# Patient Record
Sex: Female | Born: 1970
Health system: Southern US, Community
[De-identification: ages and names within clinical notes are randomized; demographics above are authoritative.]

## PROBLEM LIST (undated history)

## (undated) DIAGNOSIS — E079 Disorder of thyroid, unspecified: Secondary | ICD-10-CM

## (undated) DIAGNOSIS — I10 Essential (primary) hypertension: Secondary | ICD-10-CM

## (undated) DIAGNOSIS — N390 Urinary tract infection, site not specified: Secondary | ICD-10-CM

## (undated) DIAGNOSIS — M545 Low back pain, unspecified: Secondary | ICD-10-CM

## (undated) DIAGNOSIS — G4700 Insomnia, unspecified: Secondary | ICD-10-CM

## (undated) DIAGNOSIS — F32A Depression, unspecified: Secondary | ICD-10-CM

## (undated) DIAGNOSIS — E785 Hyperlipidemia, unspecified: Secondary | ICD-10-CM

## (undated) DIAGNOSIS — F329 Major depressive disorder, single episode, unspecified: Secondary | ICD-10-CM

## (undated) DIAGNOSIS — G8929 Other chronic pain: Secondary | ICD-10-CM

## (undated) HISTORY — DX: Depression, unspecified: F32.A

## (undated) HISTORY — PX: CHOLECYSTECTOMY: SHX55

## (undated) HISTORY — DX: Hyperlipidemia, unspecified: E78.5

## (undated) HISTORY — DX: Other chronic pain: G89.29

## (undated) HISTORY — DX: Urinary tract infection, site not specified: N39.0

## (undated) HISTORY — DX: Low back pain: M54.5

## (undated) HISTORY — DX: Essential (primary) hypertension: I10

## (undated) HISTORY — DX: Disorder of thyroid, unspecified: E07.9

## (undated) HISTORY — DX: Major depressive disorder, single episode, unspecified: F32.9

## (undated) HISTORY — DX: Low back pain, unspecified: M54.50

## (undated) HISTORY — DX: Insomnia, unspecified: G47.00

## (undated) HISTORY — PX: OTHER SURGICAL HISTORY: SHX169

## (undated) HISTORY — PX: BREAST SURGERY: SHX581

## (undated) HISTORY — PX: ENDOMETRIAL ABLATION: SHX621

## (undated) HISTORY — PX: TOTAL HIP ARTHROPLASTY: SHX124

---

## 1991-04-02 HISTORY — PX: BREAST EXCISIONAL BIOPSY: SUR124

## 1997-05-02 ENCOUNTER — Encounter: Admission: RE | Admit: 1997-05-02 | Discharge: 1997-07-31 | Payer: Self-pay | Admitting: Obstetrics and Gynecology

## 1997-09-06 ENCOUNTER — Ambulatory Visit (HOSPITAL_COMMUNITY): Admission: RE | Admit: 1997-09-06 | Discharge: 1997-09-06 | Payer: Self-pay | Admitting: Specialist

## 1997-09-06 ENCOUNTER — Encounter (HOSPITAL_COMMUNITY): Admission: RE | Admit: 1997-09-06 | Discharge: 1997-12-05 | Payer: Self-pay | Admitting: Obstetrics and Gynecology

## 1997-12-29 ENCOUNTER — Encounter (HOSPITAL_COMMUNITY): Admission: RE | Admit: 1997-12-29 | Discharge: 1998-03-29 | Payer: Self-pay | Admitting: *Deleted

## 1998-07-05 ENCOUNTER — Inpatient Hospital Stay (HOSPITAL_COMMUNITY): Admission: AD | Admit: 1998-07-05 | Discharge: 1998-07-05 | Payer: Self-pay | Admitting: Obstetrics and Gynecology

## 1998-09-07 ENCOUNTER — Observation Stay (HOSPITAL_COMMUNITY): Admission: RE | Admit: 1998-09-07 | Discharge: 1998-09-08 | Payer: Self-pay | Admitting: Surgery

## 1999-01-22 ENCOUNTER — Other Ambulatory Visit: Admission: RE | Admit: 1999-01-22 | Discharge: 1999-01-22 | Payer: Self-pay | Admitting: Obstetrics and Gynecology

## 1999-04-02 HISTORY — PX: THYROIDECTOMY: SHX17

## 1999-06-05 ENCOUNTER — Encounter: Payer: Self-pay | Admitting: Emergency Medicine

## 1999-06-05 ENCOUNTER — Emergency Department (HOSPITAL_COMMUNITY): Admission: EM | Admit: 1999-06-05 | Discharge: 1999-06-05 | Payer: Self-pay | Admitting: Internal Medicine

## 1999-07-25 ENCOUNTER — Inpatient Hospital Stay (HOSPITAL_COMMUNITY): Admission: AD | Admit: 1999-07-25 | Discharge: 1999-07-28 | Payer: Self-pay | Admitting: Obstetrics and Gynecology

## 1999-08-30 ENCOUNTER — Other Ambulatory Visit: Admission: RE | Admit: 1999-08-30 | Discharge: 1999-08-30 | Payer: Self-pay | Admitting: Obstetrics and Gynecology

## 2000-10-21 ENCOUNTER — Encounter: Admission: RE | Admit: 2000-10-21 | Discharge: 2000-10-21 | Payer: Self-pay | Admitting: Obstetrics and Gynecology

## 2000-10-21 ENCOUNTER — Encounter: Payer: Self-pay | Admitting: Obstetrics and Gynecology

## 2000-11-21 ENCOUNTER — Other Ambulatory Visit: Admission: RE | Admit: 2000-11-21 | Discharge: 2000-11-21 | Payer: Self-pay | Admitting: Obstetrics and Gynecology

## 2001-04-24 ENCOUNTER — Inpatient Hospital Stay (HOSPITAL_COMMUNITY): Admission: AD | Admit: 2001-04-24 | Discharge: 2001-04-24 | Payer: Self-pay | Admitting: Obstetrics and Gynecology

## 2001-05-29 ENCOUNTER — Ambulatory Visit (HOSPITAL_COMMUNITY): Admission: RE | Admit: 2001-05-29 | Discharge: 2001-05-29 | Payer: Self-pay | Admitting: Obstetrics and Gynecology

## 2001-05-30 ENCOUNTER — Inpatient Hospital Stay (HOSPITAL_COMMUNITY): Admission: AD | Admit: 2001-05-30 | Discharge: 2001-06-03 | Payer: Self-pay | Admitting: Obstetrics and Gynecology

## 2001-05-30 ENCOUNTER — Encounter (INDEPENDENT_AMBULATORY_CARE_PROVIDER_SITE_OTHER): Payer: Self-pay | Admitting: Specialist

## 2001-07-06 ENCOUNTER — Other Ambulatory Visit: Admission: RE | Admit: 2001-07-06 | Discharge: 2001-07-06 | Payer: Self-pay | Admitting: Obstetrics and Gynecology

## 2003-04-05 ENCOUNTER — Other Ambulatory Visit: Admission: RE | Admit: 2003-04-05 | Discharge: 2003-04-05 | Payer: Self-pay | Admitting: Obstetrics and Gynecology

## 2004-09-06 ENCOUNTER — Other Ambulatory Visit: Admission: RE | Admit: 2004-09-06 | Discharge: 2004-09-06 | Payer: Self-pay | Admitting: Obstetrics and Gynecology

## 2004-11-09 ENCOUNTER — Ambulatory Visit (HOSPITAL_COMMUNITY): Admission: RE | Admit: 2004-11-09 | Discharge: 2004-11-09 | Payer: Self-pay | Admitting: Obstetrics and Gynecology

## 2004-11-09 ENCOUNTER — Encounter (INDEPENDENT_AMBULATORY_CARE_PROVIDER_SITE_OTHER): Payer: Self-pay | Admitting: Specialist

## 2004-11-09 ENCOUNTER — Inpatient Hospital Stay (HOSPITAL_COMMUNITY): Admission: AD | Admit: 2004-11-09 | Discharge: 2004-11-09 | Payer: Self-pay | Admitting: Obstetrics and Gynecology

## 2005-03-26 ENCOUNTER — Emergency Department (HOSPITAL_COMMUNITY): Admission: EM | Admit: 2005-03-26 | Discharge: 2005-03-27 | Payer: Self-pay | Admitting: Emergency Medicine

## 2005-08-27 ENCOUNTER — Emergency Department (HOSPITAL_COMMUNITY): Admission: EM | Admit: 2005-08-27 | Discharge: 2005-08-28 | Payer: Self-pay | Admitting: Emergency Medicine

## 2005-10-17 ENCOUNTER — Ambulatory Visit (HOSPITAL_COMMUNITY): Admission: RE | Admit: 2005-10-17 | Discharge: 2005-10-17 | Payer: Self-pay | Admitting: General Surgery

## 2005-10-17 ENCOUNTER — Encounter (INDEPENDENT_AMBULATORY_CARE_PROVIDER_SITE_OTHER): Payer: Self-pay | Admitting: *Deleted

## 2007-01-26 ENCOUNTER — Ambulatory Visit (HOSPITAL_COMMUNITY): Admission: RE | Admit: 2007-01-26 | Discharge: 2007-01-26 | Payer: Self-pay | Admitting: Family Medicine

## 2007-03-18 ENCOUNTER — Ambulatory Visit (HOSPITAL_COMMUNITY): Admission: RE | Admit: 2007-03-18 | Discharge: 2007-03-19 | Payer: Self-pay | Admitting: Neurosurgery

## 2007-05-08 ENCOUNTER — Inpatient Hospital Stay (HOSPITAL_COMMUNITY): Admission: AD | Admit: 2007-05-08 | Discharge: 2007-05-12 | Payer: Self-pay | Admitting: Neurosurgery

## 2008-05-03 ENCOUNTER — Encounter: Admission: RE | Admit: 2008-05-03 | Discharge: 2008-05-03 | Payer: Self-pay | Admitting: Neurosurgery

## 2008-11-21 ENCOUNTER — Encounter: Admission: RE | Admit: 2008-11-21 | Discharge: 2008-11-21 | Payer: Self-pay | Admitting: Neurosurgery

## 2009-04-18 ENCOUNTER — Inpatient Hospital Stay (HOSPITAL_COMMUNITY): Admission: RE | Admit: 2009-04-18 | Discharge: 2009-04-20 | Payer: Self-pay | Admitting: Neurological Surgery

## 2010-04-21 ENCOUNTER — Encounter: Payer: Self-pay | Admitting: Family Medicine

## 2010-04-22 ENCOUNTER — Encounter: Payer: Self-pay | Admitting: Family Medicine

## 2010-06-17 LAB — COMPREHENSIVE METABOLIC PANEL
AST: 21 U/L (ref 0–37)
Albumin: 3.9 g/dL (ref 3.5–5.2)
BUN: 13 mg/dL (ref 6–23)
CO2: 27 mEq/L (ref 19–32)
Chloride: 105 mEq/L (ref 96–112)
Potassium: 4.8 mEq/L (ref 3.5–5.1)
Sodium: 138 mEq/L (ref 135–145)
Total Protein: 6.9 g/dL (ref 6.0–8.3)

## 2010-06-17 LAB — CBC
MCV: 94.2 fL (ref 78.0–100.0)
WBC: 8.9 10*3/uL (ref 4.0–10.5)

## 2010-06-17 LAB — TYPE AND SCREEN: ABO/RH(D): A POS

## 2010-08-14 NOTE — H&P (Signed)
Maria [name], CHATWIN NO.:  [id number]   MEDICAL RECORD NO.:  [id number]          PATIENT TYPE:  INP   LOCATION:  3002                         FACILITY:  [facility]   PHYSICIAN:  Hewitt Shorts, M.D.DATE OF BIRTH:  02/17/1971   DATE OF ADMISSION:  05/08/2007  DATE OF DISCHARGE:                              HISTORY & PHYSICAL   HISTORY OF PRESENT ILLNESS:  The patient is a 40 year old right-handed  white female who is 6-1/2 weeks status post right L5-S1 lumbar  laminotomy and microdiskectomy for a very large right L5-S1 disk  herniation.  She did well for the first 5 weeks but about a week and  half ago, she developed severe recurrent right lumbar radicular pain  from the right buttock extending down to the posterolateral right thigh  and leg and throughout the right foot with numbness and tingling in the  right buttock, posterolateral right thigh, and leg, and into the lateral  aspect of the right foot.  The patient was reevaluated earlier this  week.  She had been using ibuprofen and Flexeril but continued in  disabling pain.  We restudied her with MRI of the lumbar spine done  without and with gadolinium.  That study showed recurrent disk  herniation at L5-S1, not nearly as large the original disk herniation  but posteriorly displacing the right S1 nerve root.   We discussed options for further treatment and care including further  nonsurgical management as well as options for more limited surgeries,  specifically a right L5-S1 lumbar laminotomy and microdiskectomy for  recurrent disc herniation as well as more extensive surgery including L5-  S1 decompression, diskectomy, posterior lumbar interbody fusion, and  posterolateral arthrodesis.   After discussing this, she wished to go ahead with the decompression and  arthrodesis, and we are planning on working on getting that scheduled.  However, she contacted the office today because of the severity of the  pain.  She felt that she could no longer bear with the pain, and  therefore we admitted the patient for pain management and to plan on  proceeding with the surgery.   PAST MEDICAL HISTORY:  Notable for history of hypertension since 2005,  history of benign right breast tumor, benign thyroid tumor,  hypercholesterolemia, and irritable bowel syndrome.  No history of  myocardial infarction, cancer, stroke, diabetes, or lung disease.   PAST SURGICAL HISTORY:  Previous surgeries include a right breast biopsy  in October 1993; ORIF of the right elbow in 1997; right thyroid  lobectomy in June 1999; cesarean section in 2003; endometrial ablation,  D and C, and hysteroscopy in 2005; laparoscopic cholecystectomy in 2006;  and right L5-S1 lumbar diskectomy on March 18, 2007.   ALLERGIES:  She reports allergies to SULFA causing rash and COMPAZINE  making her jittery and restless, but not a true allergy but more of an  intolerance.   CURRENT MEDICATIONS:  1. Lotrel 10/20 mg daily.  2. Zocor 40 mg daily.  3. Lasix 20 mg daily.  4. Synthroid 50 mcg daily.  5. Potassium chloride 10 mEq daily.  6. Effexor  XR 75 mg daily.  7. Flexeril 10 mg three times a day.  8. Ambien CR 12.5 mg q.h.s.  9. Hydrocodone q.6 h. p.r.n. pain.   FAMILY HISTORY:  Mother is in good health at age 74 with hypertension.  Father is in good health at age 65.  There is a family history of kidney  failure, coronary artery disease, myocardial infarction, and non-Hodgkin  lymphoma.   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  She is married.  She smokes a half a pack a day.  She started smoking 5-  1/2 years ago.  She drinks alcoholic  beverages socially.  She denies  history of substance abuse.   REVIEW OF SYSTEMS:  Notable for that as is described in the history of  present illness and past medical history, but it is otherwise  unremarkable.   PHYSICAL EXAMINATION:  GENERAL: The patient  is a well-developed, well-  nourished white female in obvious pain but in no acute distress.  VITAL SIGNS:  Temperature is 99, pulse 104, and blood pressure 128/92.  LUNGS:  Clear to auscultation.  She has symmetric respiratory excursion.  HEART:  Regular rate and rhythm.  Normal S1, S2.  There is no murmur.  EXTREMITIES:  Examination shows no clubbing, cyanosis, or edema.  INTEGUMENT:  Exam shows her wound is healed well.  NEUROLOGIC:  Examination shows on motor exam 5/5 strength in the  dorsiflexors, extensor hallucis longus, and plantar flexors bilaterally.  Sensation is diminished to pinprick in the lower aspect of the right  foot.  Reflexes are 2 at the quadriceps, the left gastrocnemius is 1,  and in the right it is absent.  Toes are downgoing bilaterally.  Gait  and stance both favor the right lower extremity.   IMPRESSION:  Recurrent right lumbar radiculopathy secondary to a  recurrent right L5-S1 lumbar disk herniation documented on MRI scan.  The patient is a 6-1/2 weeks status post right L5-S1 lumbar diskectomy.  The patient is with disabling pain at this time with intact motor  function but diminished sensation in the lateral aspect of the right  foot.   PLAN:  The patient will be admitted and administered analgesics.  I have  discussed with her the situation.  She wishes to go ahead with surgical  decompression and arthrodesis.  We discussed alternatives of surgery  that of a more limited right L5-S1 lumbar laminotomy and microdiskectomy  for recurrent disk herniation.  We have discussed as well L5-S1 lumbar  decompression, diskectomy, posterior lumbar interbody fusion with  interbody implants and bone graft to the posterolateral arthrodesis with  posterior instrumentation and bone graft.  We discussed typical  length  of surgery, hospital stay and overall recuperation, limitations  postoperatively and need for postoperative immobilization in the lumbar  brace.  We  discussed risks to include risks of infection; bleeding;  possible transfusion; the risk of nerve root dysfunction, pain,  weakness, numbness, or paresthesias; the risks of dural tear, CSF  leakage, possible need for further surgery; the risk of failure of the  arthrodesis particularly in the light of her smoking history and  anesthetic risk of myocardial infarction, stroke, pneumonia, and death,  again all of which are increased due to her smoking history.  Understanding all this, she wishes to go ahead with surgery and we will  plan on scheduling that for tomorrow.  She understands it will have to  be on the add-on schedule.  We did discuss the alternatives for the  nonsurgical management as well, but she does not feel that she can  continue to bear and put up with the pain that she has been suffering  with.      Hewitt Shorts, M.D.  Electronically Signed     RWN/MEDQ  D:  05/08/2007  T:  05/09/2007  Job:  [id number]

## 2010-08-14 NOTE — Discharge Summary (Signed)
[name], [name] NO.:  [id number]   MEDICAL RECORD NO.:  [id number]          PATIENT TYPE:  INP   LOCATION:  3002                         FACILITY:  [facility]   PHYSICIAN:  Hewitt Shorts, M.D.DATE OF BIRTH:  1970/11/03   DATE OF ADMISSION:  05/08/2007  DATE OF DISCHARGE:  05/12/2007                               DISCHARGE SUMMARY   ADMISSION HISTORY:  The patient is a 40 year old woman who is 6-1/2  weeks status post right L5-S1 lumbar diskectomy for a very large disk  herniation.  She did well for the first 5 weeks, but then about a week  and  a half prior to admission, she developed severe recurrent radicular  pain.  MRI was obtained and revealed recurrent right L5-S1 lumbar disk  herniation and we had planned on proceeding with diskectomy and  arthrodesis.  However, the patient contacted me on the day of admission  because of incapacitating pain.  We admitted her for pain management and  definitive treatment.   PAST HISTORY:  Notable for hypertension, hypercholesterolemia, and  irritable bowel syndrome.   PHYSICAL EXAMINATION:  Revealed a well-developed, well-nourished woman  in obvious pain, but no acute distress.  Strength was intact through the  lower extremities, but she had diminished pin prick in the lateral  aspect of the right foot.   HOSPITAL COURSE:  The patient was admitted.  On the day following  admission, she was taken to surgery for bilateral L5-S1 lumbar  laminotomy, facetectomy, foraminotomy, and decompression of the L5 and  S1 nerve roots as well as the thecal sac and bilateral L5-S1 posterior  lumbar interbody fusion, posterolateral arthrodesis with interbody  implants, posterior instrumentation and bone graft.  Following surgery,  she has had excellent relief of the radicular pain.  She has mild  incisional discomfort.  Her greatest complaint is of numbness in the  left buttock and perineum.  She had difficulty with voiding when  the  Foley was discontinued, therefore the Foley was replaced and Dr. Boston Service from urology was consulted.  The patient was treated with  Decadron by Dr. Venetia Maxon for the numbness.  The patient was seen by  physical therapy and occupational therapy.  The physical therapist felt  there was no further need.  Occupational therapist recommended a toilet  raiser, but otherwise did not feel that there was any further OT needs.  The patient's wound is healing well.  The dressing was removed.  She has  staples.  We have asked her to return in 2 days for staple removal.  She  is to follow up with Dr. Wanda Plump regarding her hypertonic bladder.  She is being discharged with a Foley catheter in place with a leg bag.   DISCHARGE MEDICATIONS:  Discharge prescription was given for:  1. Macrobid one tablet each evening.  2. Percocet one or two tablets by mouth every 4-6 hours as needed for      pain, 60 tablets, no refills.  3. She has Flexeril at home.   DISCHARGE DIAGNOSES:  1. Recurrent lumbar disk herniation.  2. Lumbar spondylosis.  3. Lumbar degenerative disk disease.  4. Lumbar radiculopathy.  5. Urinary retention.   PLAN:  She is to follow up with Dr. Wanda Plump.  His office is going to  contact her to make the arrangements for that appointment.      Hewitt Shorts, M.D.  Electronically Signed     RWN/MEDQ  D:  05/12/2007  T:  05/12/2007  Job:  [id number]   cc:   Boston Service, M.D.

## 2010-08-14 NOTE — Op Note (Signed)
NAMEBLEN, RANSOME NO.:  [id number]   MEDICAL RECORD NO.:  [id number]          PATIENT TYPE:  INP   LOCATION:  3002                         FACILITY:  [facility]   PHYSICIAN:  Hewitt Shorts, M.D.DATE OF BIRTH:  09-Jan-1971   DATE OF PROCEDURE:  05/09/2007  DATE OF DISCHARGE:                               OPERATIVE REPORT   PREOPERATIVE DIAGNOSES:  Recurrent right L5-S1 lumbar disk herniation,  lumbar degenerative disk disease, lumbar spondylosis, and lumbar  radiculopathy.   POSTOPERATIVE DIAGNOSES:  Recurrent right L5-S1 lumbar disk herniation,  lumbar degenerative disk disease, lumbar spondylosis, and lumbar  radiculopathy.   PROCEDURE:  Bilateral L5-S1 lumbar laminectomy, facetectomy, and  foraminotomy with decompression of the exiting L5 and S1 nerve roots  bilaterally with decompression beyond that required for posterior lumbar  interbody fusion (PLIF), bilateral L5-S1 posterior lumbar interbody  fusion with AVS PEEK interbody implants and Mosaic with bone marrow  aspirate and bilateral L5-S1 posterolateral arthrodesis with radius  posterior instrumentation and Mosaic with the bone marrow aspirate with  microdissection.   SURGEON:  Hewitt Shorts, M.D.   ASSISTANT:  Danae Orleans. Venetia Maxon, M.D.   ANESTHESIA:  General endotracheal.   INDICATION:  This is a 40 year old woman at 6-1/2 weeks status post  right L5-S1 lumbar diskectomy.  She did well for 5 weeks but developed  severe recurrent pain about a week and a half ago.  MRI scan revealed  recurrent disk herniation.  There was marked disk space narrowing and  with the early recurrence, decision was made to proceed with  decompression and stabilization.   PROCEDURE:  The patient was brought to the operating room and placed on  general endotracheal anesthesia. The patient was turned to prone  position.  Lumbar region was prepped with Betadine soap and solution and  draped in a sterile fashion. The  midline was infiltrated with local  anesthetic with epinephrine.  The previous midline incision was reopened  and extended both rostrally and caudally.  Dissection was carried down  to the subcutaneous tissue.  Bipolar cautery and electrocautery used to  maintain hemostasis.  Dissection was carried down to the lumbar fascia  which was incised bilaterally.  On the left side, the paraspinal  muscles were dissected from the spinous process and lamina in  subperiosteal fashion.  On the right side, the area of the scar tissue  related to the previous laminectomy was carefully dissected.  We  dissected the paraspinal muscles from the spinous process and lamina  above the previous area of the scar tissue and self-retraining retractor  was placed.  We were able to expose the L4 and L5 lamina.  The S1 lamina  was hypoplastic prior to her original surgery.  We dissected around the  margins of the previous laminotomy on the right side and  using  magnification  and microdissection, we carefully decompressed the thecal  sac and nerve roots.  A bilateral laminotomy and facetectomy was  performed, and we identified the thecal sac and exiting L5 and S1 nerve  roots bilaterally.  The thickened ligamentum flavum was carefully  removed.  The laminotomy was performed and the facets decorticated.  We  identified the  ala of S1 and the transverse process of L5, and these  were similarly decorticated.  We then identified the annulus of the L5-  S1 disk.  There was a significant central spondylytic subligamentous  disk protrusion.  We were able to enter the disk space in the right  side.  The disk space was markedly narrowed.  We began diskectomy using  pituitary rongeur and Epstein curette, and then we proceeded with  diskectomy from the left side incising the annulus and entering into the  disk space removing the degenerated disk material again with pituitary  rongeur and micro curettes.  We were eventually  able to put a small  distraction spacer on the left side which helped to open the disk space  to be able to work from the right side and continue the diskectomy.  We  then placed a distraction spacer on the right side and proceeded with  more thorough diskectomy from the left side and progressively working  from side to side, we were able to perform a thorough diskectomy.  We  were able to remove the recurrent disk herniation which was embedded  within the epidural scar tissue on the right side in the medial ventral  epidural space, and we were able to decompress the spinal canal, thecal  sac, and exiting nerve roots.  A thorough diskectomy was performed and  then we began to prepare the end plates of L5 and S1 for the interbody  arthrodesis.   Using a variety of paddle curettes, the caudal surfaces were removed  down to a good bony surface.  We measured the height of the disk space  and selected 8 x 25 mm x 4 degrees AVS PEEK implants.  The C-arm  fluoroscope was then draped and brought in the field, and we identified  the pedicle intersecting the left-sided S1.  The left S1 pedicle was  probed and we aspirated the bone marrow aspirate which was injected over  15 cc strip of Mosaic.  We then packed the interbody implants with the  bone marrow aspirate soaked  Mosaic and then carefully retracted the  thecal sac and nerve root, we placed with the first implant on the right  side.  It was countersunk.  We then carefully retracted the thecal sac  and nerve root from the left side and placed the second implant on the  left side, again it was countersunk.  C-arm fluoroscope showed that the  implants are in good position.  We then proceeded with the  posterolateral arthrodesis.   Pedicle entry sites were identified bilaterally at L5 as well as on the  right-sided S1, each of the four pedicles was probed with a bone probe,  examined with a ball probe, good bony surfaces were noted, each of  them  was tapped to the 5.25 mm tap and we placed 5.75 x 45 mm screws  bilaterally at L5 and 5.75 x 35 mm screws bilaterally at S1.  We then  selected 30 mm prelordosed rods that were placed in the screw heads and  secured with locking caps, each  of which was subsequently tightened  against the counter torque.  We then packed the additional Mosaic with  bone marrow aspirate in the lateral gutter over the transverse process,  intertransverse space and ala bilaterally as well as within the facet  joints bilaterally.  The wound was irrigated numerous  times in the  procedure with both saline solutions and subsequently with bacitracin  solution.  Once the arthrodesis completed and hemostasis established was  confirmed, we proceeded with closure.  The deep fascia was closed with  interrupted undyed 1-0 Vicryl sutures.  Scarpa fascia was closed with  interrupted undyed 1-0 Vicryl sutures and subcuticular closure with  interrupted  inverted 2-0 and 3-0  undyed Vicryl sutures. Skin edges  were closed with surgical staples.  The wound was dressed with Adaptic  and sterile gauze.  The procedure was tolerated well.  The estimated  blood loss was 300 mL.  We returned 100 mL of CellSaver blood to the  patient.  Sponge and instrument count were correct.  Following surgery,  the patient was turned back to the supine position, reversed from the  anesthetic, extubated, and transferred to the recovery room for further  care.      Hewitt Shorts, M.D.  Electronically Signed     RWN/MEDQ  D:  05/09/2007  T:  05/10/2007  Job:  [id number]

## 2010-08-14 NOTE — Op Note (Signed)
NAMETHOMASENE, DUBOW NO.:  [id number]   MEDICAL RECORD NO.:  [id number]          PATIENT TYPE:  OIB   LOCATION:  3535                         FACILITY:  [facility]   PHYSICIAN:  Hewitt Shorts, M.D.DATE OF BIRTH:  [birth year]   DATE OF PROCEDURE:  03/18/2007  DATE OF DISCHARGE:                               OPERATIVE REPORT   PREOPERATIVE DIAGNOSES:  Right L5-S1 lumbar disc herniation, lumbar  degenerative disc disease, lumbar spondylosis, and lumbar radiculopathy.   POSTOPERATIVE DIAGNOSES:  Right L5-S1 lumbar disc herniation, lumbar  degenerative disc disease, lumbar spondylosis, and lumbar radiculopathy.   PROCEDURE:  Right L5-S1 lumbar laminotomy and microdiscectomy with  microdissection.   SURGEON:  Hewitt Shorts, M.D.   ASSISTANT:  Nelia Shi. Webb Silversmith, N.P.   ANESTHESIA:  General endotracheal.   INDICATIONS:  The patient is a 40 year old woman who presented with a  right lumbar radiculopathy.  She was found to have a very large right L5-  S1 lumbar disc herniation with a marked fragment caudally upon the body  of S1.  Decision was made to proceed with laminotomy and  microdiscectomy.   DESCRIPTION OF PROCEDURE:  The patient was brought to the operating room  and placed under general endotracheal anesthesia.  The patient was  turned to a prone position.  Lumbar region was prepped with Betadine  soap and solution and draped in sterile fashion.  The midline was  infiltrated with local anesthetic with epinephrine.  An x-ray was taken,  the L5-S1 level identified, and a midline incision was made over the L5-  S1 level and carried down to the subcutaneous tissue.  Bipolar cautery  and electrocautery was used to maintain hemostasis.  Dissection was  carried down to the lumbar fascia which was incised on the right side of  the midline and the paraspinal muscle was dissected from the spinous  processes and laminae in a subperiosteal fashion.  An x-ray was  taken  and the L5-S1 interlaminar space was identified.  Then, a self-retaining  retractor was placed and the microscope was draped and brought into the  field to provide additional navigation, illumination, and visualization.  The remainder of the decompression was performed using microdissection  and microsurgical technique.  A right L5-S1 laminotomy was performed.  Foraminotomy was performed for the right S1 nerve root and the S1  laminotomy was extended caudally to avoid exposure to the S1 nerve root  axilla.  The ligamentum flavum was removed and we identified the thecal  sac and right S1 nerve root.  Then within the epidural soft tissues  within the right S1 nerve root axilla was large disc fragment mass with  microdissection.  This was carefully dissected from the surrounding  epidural tissues, and once we were able to adequately separate the disc  fragment from the surrounding nerve root structures, we began to  mobilize and remove the disc fragment in a piecemeal fashion using a  variety of pituitary rongeurs.  Several large fragments were removed as  well as many smaller fragments and this allowed better mobilization of  the right S1 nerve  root.  We then gently retracted the thecal sac and  nerve root medially exposing the annulus of the right L5-S1 disc.  The  overlying epidural veins were coagulated and divided and then we went  ahead and incised the L5-S1 disc.  The discectomy was continued with the  use of variety of microcurettes and pituitary rongeurs and a thorough  discectomy was removed removing all loose fragments of the disc material  from the disc space and then further worked on removing additional  fragments from the epidural space further decompressing the thecal sac,  right S1 nerve root, and right S2 nerve root.  In the end, all loose  fragments of the disc material were removed from both disc space and the  epidural space and good decompression of the thecal sac  and right S1 and  S2 nerve roots was achieved.  The wound was irrigated extensively with  bacitracin solution on numerous occasions.  Hemostasis was established  with the use of bipolar cautery.  Once the discectomy was completed and  hemostasis was established, we instilled 2 mL of fentanyl and 80 mg of  Depo-Medrol into the epidural space and then proceeded with closure.  Deep fascia was closed with interrupted undyed 1 Vicryl suture, Scarpa  fascia was closed with interrupted undyed 1 Vicryl suture, the  subcutaneous and subcuticular layer closed with interrupted inverted 2-0  undyed Vicryl sutures, and the skin was closed with Dermabond.  The  procedure was tolerated well.  Estimated blood loss was 150 mL.  Sponge  and needle counts were correct.  Following surgery, the patient returned  back to supine position to be reversed from the anesthetic, extubated,  and transferred to the recovery room for further care.      Hewitt Shorts, M.D.  Electronically Signed     RWN/MEDQ  D:  03/18/2007  T:  03/18/2007  Job:  [id number]

## 2010-08-17 NOTE — Op Note (Signed)
[facility]  Patient:    Maria [name], Maria [name] Visit Number: [id number] MRN: [id number]          Service Type: OBS Location: [id number] Attending Physician:  Frederich Balding Dictated by:   Juluis Mire, M.D. Proc. Date: 05/30/01 Admit Date:  05/30/2001                             Operative Report  PREOPERATIVE DIAGNOSES:       1. Intrauterine pregnancy at 36-1/2 weeks.                               2. Breech presentation.                               3. Mature fetal pulmonary studies.                               4. Multiparity, desires sterility.  POSTOPERATIVE DIAGNOSES:      1. Intrauterine pregnancy at 36-1/2 weeks.                               2. Breech presentation.                               3. Mature fetal pulmonary studies.                               4. Multiparity, desires sterility.  OPERATIVE PROCEDURES:         1. Low transverse cesarean section.                               2. Bilateral tubal ligation.  SURGEON:                      Juluis Mire, M.D.  ANESTHESIA:                   Spinal.  ESTIMATED BLOOD LOSS:         800 cc.  PACKS AND DRAINS:             None.  INTRAOPERATIVE BLOOD REPLACED:               None.  INDICATIONS:                  As dictated in the history and physical.  The patient is desirous of permanent sterilization.  We have discussed with her the potential irreversibility of sterilization.  Alternatives were discussed. A failure rate of 1 in 200 was quoted.  Failures can be in the form of ectopic pregnancy requiring further surgical management.  DESCRIPTION OF PROCEDURE:     The patient was taken to the OR and placed in the supine position with left lateral tilt.  After a satisfactory level of spinal anesthesia was obtained, the abdomen was prepped out with Betadine and draped as a sterile field.  A low transverse skin incision was made with a knife and carried through the  subcutaneous tissue.  The fascia was entered sharply and the incision in the fascia extended laterally.  The fascia was taken off the muscle superiorly and inferiorly.  The rectus muscles were separated in the midline.  The peritoneum was entered.  The incision in the peritoneum was extended both superiorly and inferiorly.  A low transverse bladder flap was developed.  A low transverse uterine incision was begun in a knife and extended laterally using manual traction.  Amniotic fluid was clear. The infant was in the frank breech presentation.  The infant was a viable female who weighed 6 lb 15 oz.  Apgars were 9/9.  Umbilical artery pH was 7.33.  The placenta was delivered manually.  The uterus was sthen exteriorized.  The uterus was closed with interlocking suture of 0 chromic using a two-layer closure technique.  Hemostasis was excellent.  Urine output was clear and adequate.  Both tubes and ovaries were unremarkable.  Each tube was elevated with a Babcock tenaculum.  A hole was made in an avascular area of the mesosalpinx.  Individual ligatures were used to ligate off a segment of tube. The intervening segment of tube was then excised.  The cut ends of the tubes were cauterized.  We had good hemostasis bilaterally.  The uterus was returned to the abdominal cavity.  The pelvic cavity was thoroughly irrigated and hemostasis was excellent.  The muscles were reapproximated with running suture of 3-0 Vicryl.  The fascia was closed with a running suture of 0 PDS.  The subcu was closed with a running suture of 3-0 Vicryl.  The skin was closed with staples and Steri-Strips.  Sponge, needle and instrument counts were reported as correct by the circulating nurse x 2.  The Foley catheter was clear at the time of closure.  The patient tolerated the procedure well and was returned to the recovery room in good condition. Dictated by:   Juluis Mire, M.D. Attending Physician:  Frederich Balding DD:   05/30/01 TD:  05/30/01 Job: [id number]

## 2010-08-17 NOTE — Op Note (Signed)
Maria [name], Maria [name] NO.:  [id number]   MEDICAL RECORD NO.:  [id number]          PATIENT TYPE:  AMB   LOCATION:  SDC                           FACILITY:  [facility]   PHYSICIAN:  Juluis Mire, M.D.   DATE OF BIRTH:  07/03/1970   DATE OF PROCEDURE:  11/09/2004  DATE OF DISCHARGE:                                 OPERATIVE REPORT   PREOPERATIVE DIAGNOSIS:  Menorrhagia.   POSTOPERATIVE DIAGNOSIS:  Menorrhagia.   OPERATION/PROCEDURE:  1.  Hysteroscopy.  2.  Endometrial curettings.  3.  NovaSure ablation.   SURGEON:  Juluis Mire, M.D.   ANESTHESIA:  General.   ESTIMATED BLOOD LOSS:  Minimal.   PACKS AND DRAINS:  None.   INTRAOPERATIVE BLOOD REPLACED:  None.   COMPLICATIONS:  None.   INDICATIONS:  Dictated in history and physical.   DESCRIPTION OF PROCEDURE:  The patient was taken to the OR, placed in the  supine position.  After satisfactory general anesthesia was obtained,  the  patient was placed in the dorsal lithotomy position using the Allen  stirrups.  The patient perineum and vagina were prepped out and with  Betadine and draped in the sterile field.  Speculum was placed in the  vaginal vault.  Cervix grasped with the single-tooth tenaculum.  Endometrial  sound length was 10 cm and the cervical sound length was 4 cm.  Cervix was dilated to size 26 Pratt dilator.  Nonoperative hysteroscope was  introduced into the intrauterine cavity.  Intrauterine cavity was entered  using lactated Ringer's.  Visualization revealed normal endometrial cavity.  There was no evidence of polyps or other abnormalities.  Hysteroscope was  then removed.  Endometrial curettings were obtained and sent for  pathological view.  The NovaSure was then introduced through a cavity length  of 6 cm.  It was drawn back.  The NovaSure was expanded in the intrauterine  cavity.  After maneuvers, total cavity width was 4.6 cm.  CO2 test passed.  Ablation was undertaken at a power of  152.  The NovaSure was then removed  intact.  Single-  tooth tenaculum and speculum were then removed.  The patient was taken out  of the dorsal lithotomy position. Once alert and extubated was transferred  to the recovery room in good condition.  Sponge, instrument and needle  counts reported as correct by the circulating nurse.      Juluis Mire, M.D.  Electronically Signed     JSM/MEDQ  D:  11/09/2004  T:  11/09/2004  Job:  [id number]

## 2010-08-17 NOTE — Op Note (Signed)
NAMEZEVA, LEBER NO.:  [id number]   MEDICAL RECORD NO.:  [id number]          PATIENT TYPE:  AMB   LOCATION:  DAY                          FACILITY:  [facility]   PHYSICIAN:  Adolph Pollack, M.D.DATE OF BIRTH:  02-07-1971   DATE OF PROCEDURE:  10/17/2005  DATE OF DISCHARGE:                                 OPERATIVE REPORT   PREOPERATIVE DIAGNOSIS:  Symptomatic cholelithiasis.   POSTOPERATIVE DIAGNOSIS:  Chronic cholecystitis with cholelithiasis.   PROCEDURE:  Laparoscopic cholecystectomy with intraoperative cholangiogram.   SURGEON:  Adolph Pollack, M.D.   ASSISTANT:  Ovidio Kin, M.D.   ANESTHESIA:  General.   INDICATIONS:  [name] is a 40 year old female who been having biliary colic  type pain repeatedly and with increasing frequency recently.  She has  gallstones on ultrasound and normal liver function tests and now presents  for laparoscopic cholecystectomy.  We have discussed the procedure, risks  and aftercare preoperatively.   TECHNIQUE:  She was seen in the holding area and then brought to the  operating room and placed supine on the operating table, and a general  anesthetic was administered.  The abdominal wall was sterilely prepped and  draped.  In the supraumbilical region, dilute Marcaine solution was  infiltrated superficially and deep.  A supraumbilical incision was made  through skin, subcutaneous tissue.  The fascia was identified and a small  incision made in the fascia.  The peritoneal cavity was then entered under  direct vision.  A pursestring suture of 0 Vicryl was placed around the  fascial edges.  A Hassan trocar was introduced into the peritoneal cavity,  and a pneumoperitoneum was created by insufflation of CO2 gas.   The laparoscope was introduced and no underlying visceral injury was noted.  The patient was then placed in a reversed Trendelenburg position with the  right side tilted slightly up.  An 11-mm trocar was  placed through an  epigastric incision and two 5-mm trocars placed in the right upper quadrant  region.  The fundus of the gallbladder was identified.  The gallbladder was  a pale red color.  The fundus was grasped and retracted toward the right  shoulder.  There were no significant adhesions between the omentum or  intestine with the gallbladder.  I then used blunt dissection to mobilize  the infundibulum.  I isolated the cystic duct and created a window around  it.  A clip was placed at the cystic duct gallbladder junction.  A small  incision was made in the cystic duct.  I milked some small stones and sludge  out of the cystic duct.  A cholangiocath was then passed through the  anterior abdominal wall and placed into the cystic duct, and a cholangiogram  was performed.   Under real time fluoroscopy, dilute contrast was injected into the cystic  duct which was of moderate length.  The common hepatic, right and left  hepatic, and common bile ducts all opacified promptly and contrast drained  into the duodenum without obvious evidence of obstruction.  Final reports  pending the radiologist's interpretation.  The cholangiocatheter was removed, the cystic duct was clipped three times  proximally and then divided.  I then identified an anterior branch of the  cystic artery and created a window around it.  It was clipped and divided.  The posterior branch of the cystic artery was identified and a window  created around it.  It was clipped and divided.  The gallbladder was then  dissected free from the liver using electrocautery and placed in an  Endopouch bag.   The gallbladder fossa was irrigated, and bleeding was controlled with  electrocautery.  It was once again evaluated, and hemostasis was adequate.  No bile leak was noted.   The gallbladder was then removed in the Endopouch bag through the  supraumbilical port.  The supraumbilical fascial defect was then closed  under  laparoscopic vision by tightening up and tying down the pursestring  suture.  Irrigation fluid was evacuated, trocars removed and  pneumoperitoneum was released.  The skin incisions were closed with 4-0  Monocryl subcuticular stitches followed by Steri-Strips and sterile  dressings.  She tolerated the procedure well without any apparent  complications and was taken to the recovery in satisfactory condition.      Adolph Pollack, M.D.  Electronically Signed     TJR/MEDQ  D:  10/17/2005  T:  10/17/2005  Job:  [id number]   cc:   Ernestina Penna, M.D.  Fax: [phone]   Juluis Mire, M.D.  Fax: [phone]

## 2010-08-17 NOTE — H&P (Signed)
Maria [name], DUPLECHAIN NO.:  [id number]   MEDICAL RECORD NO.:  [id number]           PATIENT TYPE:   LOCATION:                                 FACILITY:   PHYSICIAN:  Juluis Mire, M.D.        DATE OF BIRTH:   DATE OF ADMISSION:  DATE OF DISCHARGE:                                HISTORY & PHYSICAL   The patient is a 40 year old gravida 4, para 4, married female who presents  for hysteroscopy and NovaSure ablation.  Previous bilateral tubal ligation.  Cycles have become increasingly heavy for the patient.  She reports every 28-  day cycles.  Flow is requiring a super tampon every hour on the heaviest  day.  She also has spotting seven to 10 days prior to the onset of her  periods.  She has a total of 14 days of bleed and, as noted, as had a  previous bilateral tubal ligation.  Underwent a saline infusion ultrasound  that was unremarkable except for findings of possible adenomyosis.  After  discussion of options including birth control pills, Mirena IUD,  hysteroscopy and ablative techniques, the patient presents for hysteroscopy  and NovaSure ablation.   In terms of allergies, allergic to SULFA.   MEDICATIONS:  Synthroid.  Ambien as needed for sleep.   PAST MEDICAL HISTORY:  Usual childhood diseases, no significant sequelae.  Did have a previous removal of thyroid, and that is why she is on her  Synthroid replacement.  Other surgical history:  She has had elbow surgery  and she has had a breast biopsy.   PAST OBSTETRICAL HISTORY:  She has had four vaginal deliveries.  Did have a  postpartum bilateral tubal ligation.   FAMILY HISTORY:  Noncontributory.   SOCIAL HISTORY:  Does have a pack per day tobacco use, occasional alcohol  use.   REVIEW OF SYSTEMS:  Noncontributory.   PHYSICAL EXAMINATION:  VITAL SIGNS:  The patient is afebrile with stable  vital signs.  HEENT:  Patient normocephalic.  Pupils equal, round, and reactive to light  and accommodation.   Extraocular movements were intact.  Sclerae and  conjunctivae clear.  Oropharynx clear.  NECK:  Without thyromegaly.  BREASTS:  No discrete masses.  CHEST:  Lungs clear.  CARDIAC:  Regular rhythm and rate without murmurs or gallops.  ABDOMEN:  Benign.  No mass, organomegaly or tenderness.  PELVIC:  Normal external genitalia.  Vaginal mucosa is clear.  Cervix  unremarkable.  Uterus normal size, shape and contour.  Adnexa free of masses  or tenderness.  RECTAL:  Numerous external hemorrhoids.  EXTREMITIES:  Trace edema.  NEUROLOGIC:  Grossly within normal limits.   IMPRESSION:  1.  Menorrhagia.  2.  Hypothyroidism.   PLAN:  The patient will undergo hysteroscopy with NovaSure ablation.  Success rates of 80-90% are quoted.  The risks have been explained,  including the risk of infection; the risk of vascular injury that could lead  to hemorrhage requiring transfusion or possible hysterectomy; risk of injury  to adjacent organs through perforation that  could require exploratory  surgery; the risk of deep venous thrombosis and pulmonary embolus.  Again,  alternatives have been explained.      Juluis Mire, M.D.  Electronically Signed     JSM/MEDQ  D:  11/09/2004  T:  11/09/2004  Job:  [id number]

## 2010-08-17 NOTE — H&P (Signed)
[facility]  Patient:    Maria [name], Maria [name] Visit Number: [id number] MRN: [id number]          Service Type: Attending:  Juluis Mire, M.D. Dictated by:   Juluis Mire, M.D. Adm. Date:  05/30/01                           History and Physical  CHIEF COMPLAINT:              The patient is a 40 year old gravida 4 para 3 married white female, last menstrual period September 26, 2000, giving her an estimated date of confinement of June 23, 2001.  This gives her an estimated gestational age of [redacted] weeks and four days.  She is admitted at the present time to undergo primary cesarean section.  HISTORY OF PRESENT ILLNESS:   In relation to the present admission, her pregnancy has been complicated by history of pregnancy induced hypertension with prior pregnancy, and probably some form of chronic hypertension.  Her blood pressure became elevated throughout the pregnancy.  She was eventually placed on Aldomet for management.  We have been watching carefully in terms of serial nonstress testing as well as ultrasounds, with no evidence of uterus or placental insufficiency.  She has been found on serial ultrasounds to have a persistent breech presentation.  We have given her the option of external cephalic version, which she declined, and presents at the present time to undergo primary cesarean section.  It is of note that yesterday she underwent amniocentesis.  Fetal pulmonary maturity studies revealed an LS of 2.8:1 with phosphatidylglycerol.  ______ NST was reactive.  ALLERGIES:                    SULFA.  MEDICATIONS:                  1. Prenatal vitamins.                               2. Aldomet.                               3. Synthroid.  PRENATAL LABORATORY DATA:     The patient is A-positive.  Negative antibody screen.  Nonreactive serology.  Negative hepatitis B surface antigen.  A 50 g glucola was 93.  She does have a positive group B strep.  PAST  MEDICAL HISTORY/FAMILY HISTORY/SOCIAL HISTORY:       Please see prenatal records.  REVIEW OF SYSTEMS:            Noncontributory.  PHYSICAL EXAMINATION:  VITAL SIGNS:                  The patient is afebrile with stable vital signs.  HEENT:                        Normocephalic.  PERRLA.  EOMI.  Sclerae and conjunctivae clear.  Oropharynx clear.  NECK:                         Without thyromegaly.  BREAST:                       No discrete masses but very glandular.  LUNGS:  Clear.  CARDIAC:                      Regular rate and rhythm with 2/6 systolic ejection murmur.  No clicks or gallops.  ABDOMEN:                      Gravid uterus consistent with dates.  PELVIC:                       Cervix long and closed.  EXTREMITIES:                  Trace edema.  NEUROLOGIC:                   Grossly within normal limits.  Deep tendon reflexes 2+, no clonus.  LABORATORY DATA:              Ultrasound confirmed breech presentation.  IMPRESSION:                   1. Intrauterine pregnancy at 36+ weeks with                                  breech presentation and mature fetal                                  pulmonary studies.                               2. Chronic hypertension.  PLAN:                         The patient is to undergo primary cesarean section.  The risks of surgery have been discussed including the risks of anesthesia, the risk of infection, the risk of hemorrhage that could necessitate transfusion with risk of AIDS or hepatitis, risk of injury to adjacent organs including bladder, bowel, or ureters that could require further exploratory surgery, the risk of deep vein thrombosis and pulmonary embolus.  The patient expressed understanding of indications and risks. Dictated by:   Juluis Mire, M.D. Attending:  Juluis Mire, M.D. DD:  05/30/01 TD:  05/30/01 Job: [id number]

## 2010-08-17 NOTE — H&P (Signed)
[facility]  Patient:    Maria [name], Maria [name]                        MRN: [id number] Adm. Date:  [id number] Attending:  Frederich Balding                         History and Physical  HISTORY OF PRESENT ILLNESS:   Patient is a 40 year old gravida 3, para 2, married white female, estimated date of confinement of May 15th, which is consistent with ultrasound and initial examination.  This gives her an estimated gestational age of [redacted] weeks.  Her prenatal course has been complicated by pregnancy-induced hypertension, for which she is admitted at the present time to undergo Cytotec ripening of the cervix and subsequent induction.  In relation to this admission, patients prior pregnancy has been complicated by  elevated blood pressure during the third trimester.  She has never had full-blown preeclampsia.  We have noted a gradual rise in her mean arterial pressure throughout pregnancy, with values of 130-140/80-the-upper-90s.  She has had repetitive PIH panels, all of which have been normal.  There is no evidence of elevated liver function tests or drop in platelets.  We have also watched the baby closely.  Ultrasounds have continued to reveal adequate growth with no evidence of intrauterine growth retardation.  There has been no evidence of decreased amniotic fluid.  Nonstress testing has been reactive.  But in view of the continued elevating mean arterial pressure and advanced maternal age, the patient does present now for Cytotec ripening of the cervix and induction of labor.  Patients prenatal course was also complicated by previous thyroid surgery in June of last year.  She had a thyroid lobe removed and is presently on Synthroid replacement.  Her thyroid panels have been normal.  She also has a history of positive group B beta strep with prior pregnancies and will be treated empirically with this pregnancy.  ALLERGIES:                     Allergic to SULFA.  MEDICATIONS:                  Prenatal vitamins and Synthroid.  NOTE:                         For past medical history, family history and social history, please see prenatal records.  REVIEW OF SYSTEMS:            Noncontributory.  PHYSICAL EXAMINATION:  VITAL SIGNS:                  Patients blood pressure is 140/90.  Other vital signs are stable.  HEENT:                        Patient is normocephalic.  Pupils are equal, round and reactive to light and accommodation.  Extraocular movements were intact. Sclerae and conjunctivae clear.  Oropharynx clear.  NECK:                         Without thyromegaly.  Previous thyroid surgery is  noted.  LUNGS:                        Clear.  CARDIAC:  Regular rhythm and rate with a grade 2/6 systolic  ejection murmur.  No clicks or gallops.  ABDOMEN:                      Gravid uterus consistent with dates.  PELVIC:                       Cervix externally is fingertip, although internally closed and still has relatively good length.  Vertex presenting.  EXTREMITIES:                  There is 1+ edema.  Deep tendon reflexes are 2+. No clonus.  IMPRESSION:                   1. Intrauterine pregnancy at 38 weeks with elevating                                  mean arterial pressure.                               2. Positive group B beta streptococcus.                               3. Hypothyroidism.  PLAN:                         The patient will undergo Cytotec ripening of the cervix with subsequent induction of labor.  Patient will be given antibiotics for her group B strep in the form of penicillin G.  Will watch blood pressure closely. Questionable issue of whether magnesium sulfate will need to be started.  Will check PIH labs. DD:  07/26/99 TD:  07/26/99 Job: [id number]

## 2010-08-17 NOTE — Discharge Summary (Signed)
[facility]  Patient:    Maria [name], Maria [name] Visit Number: [id number] MRN: [id number]          Service Type: OBS Location: [id number] Attending Physician:  Frederich Balding Dictated by:   Danie Chandler, R.N. Admit Date:  05/30/2001 Discharge Date: 06/03/2001                             Discharge Summary  ADMISSION DIAGNOSES: 1. Intrauterine pregnancy at 36-1/[redacted] weeks gestation. 2. Breech presentation. 3. Mature fetal pulmonary studies. 4. Multiparity, desires sterility.  DISCHARGE DIAGNOSES: 1. Intrauterine pregnancy at 36-1/[redacted] weeks gestation. 2. Breech presentation. 3. Mature fetal pulmonary studies. 4. Multiparity, desires sterility.  PROCEDURE:  On May 30, 2001, primary low transverse cesarean section and bilateral tubal ligation.  REASON FOR ADMISSION:  Please see dictated H&P.  HOSPITAL COURSE:  The patient was taken to the operating room and underwent the above named procedure without complication.  This was productive of a viable female infant with Apgars of 9 at one minute and 9 at five minutes, and an arterial cord pH of 7.33.  Postoperatively, on day #1, the patients vital signs were stable.  Baby was in NICU in stable condition.  The patient did have a good return of bowel function on this day.  Her hemoglobin was 9.8, hematocrit 28.6, and white blood cell count 11.4.  The patient did have some bruising around the incision.  She had a repeat CBC ordered for the following day.  She was started on iron daily, and was also started on p.o. Augmentin. On postoperative day #2, the patient was tolerating a regular diet, ambulating well without difficulty.  Hemoglobin was 10.4, hematocrit 30.0, and white blood cell count 10.4.  The patient was continued on Augmentin, and on postoperative day #3, the patient had better pain control, vital signs were stable, there was decreased bruising and redness around the incision.  She  was continued on Augmentin, and she was discharged home on postoperative day #4.  CONDITION ON DISCHARGE:  Good.  DIET:  Regular as tolerated.  ACTIVITY:  No heavy lifting, no driving, no vaginal entry.  FOLLOWUP:  In the office in 1 to 2 weeks for incision check.  She is to call for temperature greater than 100 degrees, persistent nausea or vomiting, heavy vaginal bleeding, and/or redness or drainage from the incision site.  DISCHARGE MEDICATIONS: 1. Prenatal vitamin one p.o. q.d. 2. Ibuprofen p.r.n. 3. Percocet 5 mg one or two p.o. q.4-6h. p.r.n. pain.Dictated by:   Danie Chandler, R.N. Attending Physician:  Frederich Balding DD:  06/15/01 TD:  06/16/01 Job: [id number]

## 2010-12-21 LAB — URINE MICROSCOPIC-ADD ON

## 2010-12-21 LAB — DIFFERENTIAL
Basophils Absolute: 0.1
Basophils Relative: 1
Eosinophils Absolute: 0.2
Eosinophils Relative: 2
Lymphocytes Relative: 22
Lymphs Abs: 2.1
Monocytes Absolute: 0.5
Monocytes Relative: 5
Neutro Abs: 6.8
Neutrophils Relative %: 70

## 2010-12-21 LAB — BASIC METABOLIC PANEL
BUN: 10
CO2: 26
Calcium: 9.4
Chloride: 105
Creatinine, Ser: 0.82
GFR calc Af Amer: >60
GFR calc non Af Amer: >60
Glucose, Bld: 103 — ABNORMAL HIGH
Potassium: 4.3
Sodium: 137

## 2010-12-21 LAB — URINE CULTURE
Colony Count: >=100000
Colony Count: NO GROWTH
Culture: NO GROWTH
Special Requests: NEGATIVE

## 2010-12-21 LAB — URINALYSIS, ROUTINE W REFLEX MICROSCOPIC
Bilirubin Urine: NEGATIVE
Bilirubin Urine: NEGATIVE
Glucose, UA: NEGATIVE
Glucose, UA: NEGATIVE
Hgb urine dipstick: NEGATIVE
Ketones, ur: NEGATIVE
Ketones, ur: NEGATIVE
Nitrite: NEGATIVE
Nitrite: NEGATIVE
Protein, ur: NEGATIVE
Protein, ur: NEGATIVE
Specific Gravity, Urine: 1.008
Specific Gravity, Urine: 1.018
Urobilinogen, UA: 1
Urobilinogen, UA: 1
pH: 6
pH: 6.5

## 2010-12-21 LAB — CBC
HCT: 38.3
Hemoglobin: 12.9
MCHC: 33.8
MCV: 93.8
Platelets: 322
RBC: 4.09
RDW: 13.1
WBC: 9.7

## 2010-12-21 LAB — TYPE AND SCREEN
ABO/RH(D): A POS
Antibody Screen: NEGATIVE

## 2010-12-21 LAB — ABO/RH: ABO/RH(D): A POS

## 2011-01-07 LAB — CBC
HCT: 36.5
Hemoglobin: 12.4
MCHC: 33.9
MCV: 94
Platelets: 327
RBC: 3.89
RDW: 13.6
WBC: 9.3

## 2011-01-07 LAB — BASIC METABOLIC PANEL
BUN: 14
CO2: 25
Calcium: 9.2
Chloride: 107
Creatinine, Ser: 0.65
GFR calc Af Amer: >60
GFR calc non Af Amer: >60
Glucose, Bld: 97
Potassium: 4.1
Sodium: 137

## 2011-12-23 ENCOUNTER — Other Ambulatory Visit: Payer: Self-pay | Admitting: Physician Assistant

## 2011-12-23 DIAGNOSIS — M545 Low back pain: Secondary | ICD-10-CM

## 2012-01-04 ENCOUNTER — Inpatient Hospital Stay: Admission: RE | Admit: 2012-01-04 | Payer: Self-pay | Source: Ambulatory Visit

## 2012-01-11 ENCOUNTER — Inpatient Hospital Stay: Admission: RE | Admit: 2012-01-11 | Payer: Self-pay | Source: Ambulatory Visit

## 2012-01-18 ENCOUNTER — Other Ambulatory Visit: Payer: Self-pay

## 2012-01-28 ENCOUNTER — Other Ambulatory Visit: Payer: Self-pay | Admitting: Family Medicine

## 2012-01-28 DIAGNOSIS — R609 Edema, unspecified: Secondary | ICD-10-CM

## 2012-01-29 ENCOUNTER — Ambulatory Visit (HOSPITAL_COMMUNITY)
Admission: RE | Admit: 2012-01-29 | Discharge: 2012-01-29 | Disposition: A | Discharge status: Home / Self Care | Payer: 59 | Source: Ambulatory Visit | Attending: Family Medicine | Admitting: Family Medicine

## 2012-01-29 ENCOUNTER — Other Ambulatory Visit: Payer: Self-pay | Admitting: Family Medicine

## 2012-01-29 DIAGNOSIS — M7989 Other specified soft tissue disorders: Secondary | ICD-10-CM | POA: Insufficient documentation

## 2012-01-29 DIAGNOSIS — R609 Edema, unspecified: Secondary | ICD-10-CM

## 2012-07-28 ENCOUNTER — Other Ambulatory Visit: Payer: Self-pay | Admitting: *Deleted

## 2012-07-28 MED ORDER — ATORVASTATIN CALCIUM 40 MG PO TABS: 40.0000 mg | ORAL_TABLET | Freq: Every day | ORAL | Status: DC

## 2012-07-28 NOTE — Telephone Encounter (Signed)
Patient last seen on 01-28-12. Labs done that day as well. Please advise

## 2012-07-30 ENCOUNTER — Telehealth: Payer: Self-pay | Admitting: Nurse Practitioner

## 2012-07-30 NOTE — Telephone Encounter (Signed)
appt made

## 2012-08-10 ENCOUNTER — Encounter: Payer: Self-pay | Admitting: Nurse Practitioner

## 2012-08-10 ENCOUNTER — Ambulatory Visit (INDEPENDENT_AMBULATORY_CARE_PROVIDER_SITE_OTHER): Payer: 59 | Admitting: Nurse Practitioner

## 2012-08-10 VITALS — BP 115/80 | HR 70 | Temp 97.1°F | Ht 69.5 in | Wt 182.0 lb

## 2012-08-10 DIAGNOSIS — G8929 Other chronic pain: Secondary | ICD-10-CM | POA: Insufficient documentation

## 2012-08-10 DIAGNOSIS — M545 Low back pain: Secondary | ICD-10-CM

## 2012-08-10 DIAGNOSIS — E785 Hyperlipidemia, unspecified: Secondary | ICD-10-CM

## 2012-08-10 DIAGNOSIS — E039 Hypothyroidism, unspecified: Secondary | ICD-10-CM

## 2012-08-10 DIAGNOSIS — M549 Dorsalgia, unspecified: Secondary | ICD-10-CM | POA: Insufficient documentation

## 2012-08-10 DIAGNOSIS — G4700 Insomnia, unspecified: Secondary | ICD-10-CM | POA: Insufficient documentation

## 2012-08-10 DIAGNOSIS — F329 Major depressive disorder, single episode, unspecified: Secondary | ICD-10-CM

## 2012-08-10 DIAGNOSIS — F32A Depression, unspecified: Secondary | ICD-10-CM | POA: Insufficient documentation

## 2012-08-10 LAB — COMPLETE METABOLIC PANEL WITH GFR
ALT: <8 U/L (ref 0–35)
CO2: 25 mEq/L (ref 19–32)
Creat: 0.82 mg/dL (ref 0.50–1.10)
GFR, Est African American: >89 mL/min
GFR, Est Non African American: 88 mL/min
Total Bilirubin: 0.4 mg/dL (ref 0.3–1.2)

## 2012-08-10 MED ORDER — FENTANYL 75 MCG/HR TD PT72: 1.0000 | MEDICATED_PATCH | TRANSDERMAL | Status: DC

## 2012-08-10 MED ORDER — ZOLPIDEM TARTRATE ER 12.5 MG PO TBCR: 12.5000 mg | EXTENDED_RELEASE_TABLET | Freq: Every evening | ORAL | Status: DC | PRN

## 2012-08-10 MED ORDER — TAPENTADOL HCL 50 MG PO TABS: 50.0000 mg | ORAL_TABLET | Freq: Four times a day (QID) | ORAL | Status: DC

## 2012-08-10 MED ORDER — SERTRALINE HCL 50 MG PO TABS: 50.0000 mg | ORAL_TABLET | Freq: Every day | ORAL | Status: DC

## 2012-08-10 MED ORDER — GABAPENTIN 600 MG PO TABS: 600.0000 mg | ORAL_TABLET | Freq: Three times a day (TID) | ORAL | Status: DC

## 2012-08-10 NOTE — Progress Notes (Signed)
Subjective:    Patient ID: Maria [name], female    DOB: July 15, 1970, 42 y.o.   MRN: [id number]  Hypertension This is a chronic problem. The current episode started more than 1 year ago. The problem is unchanged. The problem is controlled. Pertinent negatives include no chest pain, headaches, neck pain, palpitations, peripheral edema or shortness of breath. There are no associated agents to hypertension. Risk factors for coronary artery disease include dyslipidemia and sedentary lifestyle. Past treatments include ACE inhibitors, calcium channel blockers and diuretics. Compliance problems include exercise.  Hypertensive end-organ damage includes a thyroid problem.  Hyperlipidemia This is a chronic problem. The current episode started more than 1 year ago. The problem is uncontrolled. Recent lipid tests were reviewed and are high. Exacerbating diseases include hypothyroidism. She has no history of diabetes. There are no known factors aggravating her hyperlipidemia. Pertinent negatives include no chest pain, focal weakness, leg pain, myalgias or shortness of breath. Current antihyperlipidemic treatment includes statins. The current treatment provides moderate improvement of lipids. There are no compliance problems.   Thyroid Problem Presents for follow-up visit. Patient reports no anxiety, diarrhea, dry skin, fatigue, hoarse voice or palpitations. The symptoms have been stable. Her past medical history is significant for hyperlipidemia. There is no history of diabetes.  Chronic Low back Pain Durogesic patch- Pain clinic- Patient wants to quit Going because her provider is leaving and she doesn't want to go out there again. Also on dilaudid 4 mg daily. Husband keeps control of her meds. Depression Zoloft working well. Insomnia Ambien helps her sleep at night. Feels rested in AM.   Review of Systems  Constitutional: Negative for fatigue.  HENT: Negative for hoarse voice and neck pain.   Respiratory:  Negative for shortness of breath.   Cardiovascular: Negative for chest pain and palpitations.  Gastrointestinal: Negative for diarrhea.  Genitourinary: Negative.   Musculoskeletal: Negative for myalgias.  Neurological: Negative.  Negative for focal weakness and headaches.  Psychiatric/Behavioral: Negative.        Objective:   Physical Exam  Constitutional: She is oriented to person, place, and time. She appears well-developed and well-nourished.  HENT:  Nose: Nose normal.  Mouth/Throat: Oropharynx is clear and moist.  Eyes: EOM are normal.  Neck: Trachea normal, normal range of motion and full passive range of motion without pain. Neck supple. No JVD present. Carotid bruit is not present. No thyromegaly present.  Cardiovascular: Normal rate, regular rhythm, normal heart sounds and intact distal pulses.  Exam reveals no gallop and no friction rub.   No murmur heard. Pulmonary/Chest: Effort normal and breath sounds normal.  Abdominal: Soft. Bowel sounds are normal. She exhibits no distension and no mass. There is no tenderness.  Musculoskeletal: Normal range of motion.  (+) SLR bil  Lymphadenopathy:    She has no cervical adenopathy.  Neurological: She is alert and oriented to person, place, and time. She has normal reflexes.  Skin: Skin is warm and dry.  Psychiatric: She has a normal mood and affect. Her behavior is normal. Judgment and thought content normal.  BP 115/80  Pulse 70  Temp(Src) 97.1 F (36.2 C) (Oral)  Ht 5' 9.5" (1.765 m)  Wt 182 lb (82.555 kg)  BMI 26.5 kg/m2         Assessment & Plan:  1. Hypothyroidism   2. Hyperlipidemia Low fat diet and exercise - COMPLETE METABOLIC PANEL WITH GFR - NMR Lipoprofile with Lipids  3. Depression Stress management - sertraline (ZOLOFT) 50 MG tablet; Take  1 tablet (50 mg total) by mouth daily.  Dispense: 30 tablet; Refill: 5  4. Low back pain Pain clinic with [facility] - fentaNYL (DURAGESIC - DOSED MCG/HR) 75  MCG/HR; Place 1 patch (75 mcg total) onto the skin every 3 (three) days.  Dispense: 10 patch; Refill: 0 - gabapentin (NEURONTIN) 600 MG tablet; Take 1 tablet (600 mg total) by mouth 3 (three) times daily.  Dispense: 90 tablet; Refill: 5 - tapentadol (NUCYNTA) 50 MG TABS; Take 1 tablet (50 mg total) by mouth 4 (four) times daily.  Dispense: 120 tablet; Refill: 0  5. Insomnia Bedtime ritual - zolpidem (AMBIEN CR) 12.5 MG CR tablet; Take 1 tablet (12.5 mg total) by mouth at bedtime as needed.  Dispense: 30 tablet; Refill: 2   Maria Daphine Deutscher, FNP

## 2012-08-10 NOTE — Patient Instructions (Signed)

## 2012-08-12 LAB — NMR LIPOPROFILE WITH LIPIDS
HDL Particle Number: 23.1 umol/L — ABNORMAL LOW (ref >=30.5)
HDL Size: 9 nm — ABNORMAL LOW (ref >=9.2)
HDL-C: 38 mg/dL — ABNORMAL LOW (ref >=40)
LDL (calc): 110 mg/dL — ABNORMAL HIGH (ref <100)
LDL Particle Number: 1550 nmol/L — ABNORMAL HIGH (ref <1000)
LDL Size: 21.6 nm (ref >20.5)
LP-IR Score: 45 (ref <=45)
VLDL Size: 46 nm (ref <=46.6)

## 2012-08-17 ENCOUNTER — Telehealth: Payer: Self-pay | Admitting: Nurse Practitioner

## 2012-08-18 NOTE — Telephone Encounter (Signed)
Go back to fentanyl patch every 2 days and when run outt we will change rx

## 2012-08-18 NOTE — Telephone Encounter (Signed)
Pt notified.  Verbalized understanding.

## 2012-08-18 NOTE — Telephone Encounter (Signed)
Patient called to check on status of this message. Patient just would like to know how she can ease the symptoms of the withdrawal.  I advised just waiting on direction from provider.

## 2012-08-25 ENCOUNTER — Other Ambulatory Visit: Payer: Self-pay | Admitting: *Deleted

## 2012-08-25 MED ORDER — AMLODIPINE BESY-BENAZEPRIL HCL 10-20 MG PO CAPS: 1.0000 | ORAL_CAPSULE | Freq: Every day | ORAL | Status: DC

## 2012-08-25 NOTE — Telephone Encounter (Signed)
LAST OV 10/13 

## 2012-08-26 ENCOUNTER — Telehealth: Payer: Self-pay | Admitting: Nurse Practitioner

## 2012-08-26 DIAGNOSIS — M545 Low back pain: Secondary | ICD-10-CM

## 2012-08-26 MED ORDER — FENTANYL 75 MCG/HR TD PT72: MEDICATED_PATCH | TRANSDERMAL | Status: DC

## 2012-08-26 MED ORDER — TAPENTADOL HCL 50 MG PO TABS: 50.0000 mg | ORAL_TABLET | Freq: Four times a day (QID) | ORAL | Status: DC

## 2012-08-26 NOTE — Telephone Encounter (Signed)
Patient says you changed the orders on her Duragesic patch, so she needs them early, will use her last one Sunday. When you print this one, you can also print the nucynta and post date it per patient

## 2012-08-26 NOTE — Telephone Encounter (Signed)
Patient aware to pick up 

## 2012-08-26 NOTE — Telephone Encounter (Signed)
rx ready for pickup 

## 2012-09-03 ENCOUNTER — Telehealth: Payer: Self-pay | Admitting: Family Medicine

## 2012-09-03 NOTE — Telephone Encounter (Signed)
IS going to have to go back to pain clinic-

## 2012-09-04 ENCOUNTER — Telehealth: Payer: Self-pay | Admitting: *Deleted

## 2012-09-04 NOTE — Telephone Encounter (Signed)
Needs approval to fill nycenta on Monday- made appt for pain mgmt on 6/30.  Already has rx, just [facility] at [facility]

## 2012-09-04 NOTE — Telephone Encounter (Signed)
Pt will do pain management

## 2012-09-05 ENCOUNTER — Other Ambulatory Visit: Payer: Self-pay | Admitting: Nurse Practitioner

## 2012-09-07 NOTE — Telephone Encounter (Signed)
Ok to [facility] fill rx but patient needs to make it last until pain clinic appointment

## 2012-09-07 NOTE — Telephone Encounter (Signed)
[facility] pharmacy aware.

## 2012-09-21 ENCOUNTER — Other Ambulatory Visit: Payer: Self-pay | Admitting: Nurse Practitioner

## 2012-09-21 DIAGNOSIS — M545 Low back pain: Secondary | ICD-10-CM

## 2012-09-21 NOTE — Telephone Encounter (Signed)
Patient was given 15 fentanyl patches on 08/26/12- SHould n't need refill until 09/16/12. SHoudn't be out of patches 15 patches should last 30 days if change every 2 days.

## 2012-09-21 NOTE — Telephone Encounter (Signed)
Refill

## 2012-09-22 ENCOUNTER — Other Ambulatory Visit: Payer: Self-pay | Admitting: Nurse Practitioner

## 2012-09-22 DIAGNOSIS — M545 Low back pain: Secondary | ICD-10-CM

## 2012-09-22 MED ORDER — FENTANYL 75 MCG/HR TD PT72
MEDICATED_PATCH | TRANSDERMAL | Status: DC
Start: 2012-09-22 — End: 2012-10-15

## 2012-09-22 NOTE — Telephone Encounter (Signed)
rx ready for pickup 

## 2012-09-22 NOTE — Telephone Encounter (Signed)
Pt aware up front  °

## 2012-09-22 NOTE — Telephone Encounter (Signed)
Can we go ahead and write rx and date it to not fill til June 28th since that is a  Sat.

## 2012-09-22 NOTE — Telephone Encounter (Signed)
Oops not due until 09/26/12

## 2012-09-28 ENCOUNTER — Ambulatory Visit (INDEPENDENT_AMBULATORY_CARE_PROVIDER_SITE_OTHER): Payer: 59 | Admitting: Pharmacist

## 2012-09-28 ENCOUNTER — Other Ambulatory Visit: Payer: Self-pay | Admitting: Pharmacist

## 2012-09-28 DIAGNOSIS — M545 Low back pain: Secondary | ICD-10-CM

## 2012-09-28 MED ORDER — TAPENTADOL HCL 100 MG PO TABS: 100.0000 mg | ORAL_TABLET | Freq: Four times a day (QID) | ORAL | Status: DC | PRN

## 2012-09-28 NOTE — Progress Notes (Signed)
  Patient was referred for evaluation of chronic pain.  She previously was seen at [facility] by [facility] but when she left the practice patient wanted to be seen closer to home.  Her current regimen to control pain is:  Fentanyl patches change every 48 hours.   Nucyenta 50mg  QID - per patient use to take 100mg  when she went to [facility].   Gabapentin 600mg  tid - not taking because to helping much.   Patient has chronic back pain and neuropathy.  She has had 3 back surgeries with little improvement.  Her last surgery was in 2011.  Neurosurgeons - Dr. Danielle Dess and Dr. Newell Coral.   Other medications tried to treat pain include - vicoden, ultram and diluadid.  None of the above relieved pain significantly.   In reviewing patient's history and current regimen for pain, I think that her pain would be best managed through a physician run pain clinic.  She would like to go back to [facility] - I left message for their appt desk that pt would like appt.  We did increase her Nucyenta back to 100mg  QID.

## 2012-10-15 ENCOUNTER — Telehealth: Payer: Self-pay | Admitting: Pharmacist

## 2012-10-15 DIAGNOSIS — M545 Low back pain: Secondary | ICD-10-CM

## 2012-10-15 MED ORDER — FENTANYL 75 MCG/HR TD PT72: MEDICATED_PATCH | TRANSDERMAL | Status: DC

## 2012-10-15 MED ORDER — TAPENTADOL HCL 100 MG PO TABS
100.0000 mg | ORAL_TABLET | Freq: Four times a day (QID) | ORAL | Status: DC | PRN
Start: 2012-10-15 — End: 2013-11-03

## 2012-10-15 NOTE — Telephone Encounter (Signed)
Rx's printed today with fill date of 10/24/12 since patient's provider will be out of town next week .  Also called [facility] - patient just needs to call them to make appt.

## 2012-10-28 ENCOUNTER — Other Ambulatory Visit: Payer: Self-pay | Admitting: Nurse Practitioner

## 2012-10-30 NOTE — Telephone Encounter (Signed)
Last seen Tammy 09/28/12   Last filled 08/10/12   If approved route to nurse and call in

## 2012-10-30 NOTE — Telephone Encounter (Signed)
Last seen 09/28/12  Maria [name]  Last filled 5/12 14   If approve call in and route to nurse

## 2012-11-02 NOTE — Telephone Encounter (Signed)
Left refill authorization on voicemail 

## 2012-11-02 NOTE — Telephone Encounter (Signed)
Please call in [facility] refill

## 2012-11-17 ENCOUNTER — Telehealth: Payer: Self-pay | Admitting: Nurse Practitioner

## 2012-11-18 ENCOUNTER — Other Ambulatory Visit: Payer: Self-pay

## 2012-11-18 DIAGNOSIS — M545 Low back pain: Secondary | ICD-10-CM

## 2012-11-18 MED ORDER — FENTANYL 75 MCG/HR TD PT72: MEDICATED_PATCH | TRANSDERMAL | Status: DC

## 2012-11-18 NOTE — Telephone Encounter (Signed)
rx ready for pickup 

## 2012-11-18 NOTE — Telephone Encounter (Signed)
Last seen MMM 08/10/12   Last filled 10/24/12  If approved print and route to nurse

## 2012-11-19 ENCOUNTER — Other Ambulatory Visit: Payer: Self-pay | Admitting: Nurse Practitioner

## 2012-11-19 NOTE — Telephone Encounter (Signed)
Patient notified rx up front ready for pick up

## 2012-11-20 NOTE — Telephone Encounter (Signed)
Last seen 08/10/12  MMM Last filled 10/28/12   If approved route to nurse to call in

## 2012-11-20 NOTE — Telephone Encounter (Signed)
Too early for refill  

## 2012-11-28 ENCOUNTER — Other Ambulatory Visit: Payer: Self-pay | Admitting: Nurse Practitioner

## 2013-03-05 ENCOUNTER — Other Ambulatory Visit: Payer: Self-pay | Admitting: Nurse Practitioner

## 2013-03-11 ENCOUNTER — Other Ambulatory Visit: Payer: Self-pay | Admitting: Nurse Practitioner

## 2013-06-22 ENCOUNTER — Other Ambulatory Visit: Payer: Self-pay | Admitting: *Deleted

## 2013-06-22 MED ORDER — FUROSEMIDE 20 MG PO TABS: ORAL_TABLET | ORAL | Status: DC

## 2013-06-22 MED ORDER — AMLODIPINE BESY-BENAZEPRIL HCL 10-20 MG PO CAPS: ORAL_CAPSULE | ORAL | Status: DC

## 2013-06-22 NOTE — Telephone Encounter (Signed)
Last ov 08/10/12. ntbs

## 2013-06-22 NOTE — Telephone Encounter (Signed)
Patient NTBS for follow up and lab work  

## 2013-07-27 ENCOUNTER — Other Ambulatory Visit: Payer: Self-pay

## 2013-08-24 ENCOUNTER — Other Ambulatory Visit: Payer: Self-pay

## 2013-08-24 ENCOUNTER — Telehealth (INDEPENDENT_AMBULATORY_CARE_PROVIDER_SITE_OTHER): Payer: Self-pay

## 2013-08-24 NOTE — Telephone Encounter (Signed)
I LMOM for pt to call back. Pt has appt 6-2 for thyroid nodule but Dr Kathi Der office did not refer pt. No notation or imaging in epic. We need to know who referred pt. Where pt had imaging and where to request records. Thyroid consults are usually referred by MD office.

## 2013-08-26 ENCOUNTER — Other Ambulatory Visit: Payer: Self-pay

## 2013-08-29 ENCOUNTER — Other Ambulatory Visit: Payer: Self-pay | Admitting: Nurse Practitioner

## 2013-08-31 ENCOUNTER — Ambulatory Visit (INDEPENDENT_AMBULATORY_CARE_PROVIDER_SITE_OTHER): Payer: 59 | Admitting: Surgery

## 2013-08-31 ENCOUNTER — Encounter (INDEPENDENT_AMBULATORY_CARE_PROVIDER_SITE_OTHER): Payer: Self-pay | Admitting: Surgery

## 2013-08-31 VITALS — BP 122/68 | HR 68 | Temp 98.0°F | Resp 18 | Ht 70.0 in | Wt 161.0 lb

## 2013-08-31 DIAGNOSIS — E041 Nontoxic single thyroid nodule: Secondary | ICD-10-CM

## 2013-08-31 NOTE — Patient Instructions (Signed)

## 2013-08-31 NOTE — Progress Notes (Signed)
General [facility]  Chief Complaint  Patient presents with  . New Evaluation    evaluate left neck mass - patient is self-referred    HISTORY: Patient is a 43 year old female known to my practice from previous right thyroid lobectomy in 2000. This was for benign disease. Patient was treated with thyroid hormone suppression for a period of time but that was then discontinued. She has not been on thyroid medication for several years.  Patient had recent significant change in weight. Upon losing weight she noticed a "mass" in the left neck. This was about 6 months ago. She feels as though it has become slightly larger. She presents today for evaluation.  Patient has had no recent diagnostic studies and no recent thyroid function tests.  Past Medical History  Diagnosis Date  . Thyroid disease     hypothyroidism  . Hyperlipidemia   . Hypertension   . Depression   . Insomnia   . Frequent UTI   . Chronic low back pain     Current Outpatient Prescriptions  Medication Sig Dispense Refill  . amLODipine-benazepril (LOTREL) 10-20 MG per capsule TAKE 1 CAPSULE BY MOUTH DAILY.  30 capsule  0  . fentaNYL (DURAGESIC - DOSED MCG/HR) 75 MCG/HR 1 patch every 2 days  15 patch  0  . furosemide (LASIX) 20 MG tablet TAKE 1 TABLET BY MOUTH DAILY  30 tablet  0  . oxyCODONE-acetaminophen (PERCOCET) 10-325 MG per tablet       . promethazine (PHENERGAN) 25 MG tablet       . atorvastatin (LIPITOR) 40 MG tablet Take 1 tablet (40 mg total) by mouth daily.  30 tablet  0  . gabapentin (NEURONTIN) 600 MG tablet Take 1 tablet (600 mg total) by mouth 3 (three) times daily.  90 tablet  5  . HYDROmorphone (DILAUDID) 4 MG tablet 8 mg every 6 (six) hours as needed.       . sertraline (ZOLOFT) 50 MG tablet Take 1 tablet (50 mg total) by mouth daily.  30 tablet  5  . Tapentadol HCl 100 MG TABS Take 1 tablet (100 mg total) by mouth 4 (four) times daily as needed. May fill 10/24/2012  120  tablet  0  . zolpidem (AMBIEN CR) 12.5 MG CR tablet TAKE 1 TABLET BY MOUTH AT BEDTIME AS NEEDED  30 tablet  0   No current facility-administered medications for this visit.    Allergies  Allergen Reactions  . Compazine [Prochlorperazine Edisylate]   . Sulfa Antibiotics     Family History  Problem Relation Age of Onset  . Hypertension Mother   . Cancer Mother     breast  . Hypothyroidism Mother   . Hypertension Father   . Hypothyroidism Father     History   Social History  . Marital Status: Married    Spouse Name: N/A    Number of Children: N/A  . Years of Education: N/A   Social History Main Topics  . Smoking status: Current Every Day Smoker  . Smokeless tobacco: None  . Alcohol Use: No  . Drug Use: No  . Sexual Activity: None   Other Topics Concern  . None   Social History Narrative  . None    REVIEW OF SYSTEMS - PERTINENT POSITIVES ONLY: Denies tremor. Denies palpitation. Denies compressive symptoms.  EXAM: Filed Vitals:   08/31/13 1045  BP: 122/68  Pulse: 68  Temp: 98 F (36.7 C)  Resp: 18  GENERAL: well-developed, well-nourished, no acute distress HEENT: normocephalic; pupils equal and reactive; sclerae clear; dentition good; mucous membranes moist NECK:  No palpable masses in the right thyroid bed; left thyroid lobe maybe slightly enlarged and slightly more firm than normal; there is a palpable approximately 1.5 cm nodule in the mid left thyroid lobe; this is mobile and nontender; asymmetric on extension; no palpable anterior or posterior cervical lymphadenopathy; no supraclavicular masses; no tenderness CHEST: clear to auscultation bilaterally without rales, rhonchi, or wheezes CARDIAC: regular rate and rhythm without significant murmur; peripheral pulses are full EXT:  non-tender without edema; no deformity NEURO: no gross focal deficits; no sign of tremor   LABORATORY RESULTS: See [facility] (CHL-Epic) for most recent  results  RADIOLOGY RESULTS: See [facility] (CHL-Epic) for most recent results  IMPRESSION: #1 clinical evidence of new left thyroid nodule #2 status post right thyroid lobectomy, 2000 #3 history of hypothyroidism  PLAN: The patient and I reviewed the above findings. She has not had any diagnostic studies. We will obtain a thyroid ultrasound and a TSH level. I will contact her with those results. Depending on whether or not a true thyroid nodule is identified in the left thyroid lobe, she may require percutaneous biopsy. We will discuss this after the ultrasound is completed.  Velora Hecklerodd M. Laticha Ferrucci, MD, FACS General & Endocrine Surgery [facility]  Primary Care Physician: Rudi HeapMOORE, DONALD, MD

## 2013-09-01 LAB — TSH: TSH: 2.22 u[IU]/mL (ref 0.350–4.500)

## 2013-09-02 ENCOUNTER — Other Ambulatory Visit: Payer: 59

## 2013-09-07 NOTE — Progress Notes (Signed)
Quick Note:  TSH level is normal. History of right thyroid lobectomy.  Await thyroid ultrasound results.  Velora Heckler, MD, [facility] Office: [phone]   ______

## 2013-09-09 ENCOUNTER — Ambulatory Visit
Admission: RE | Admit: 2013-09-09 | Discharge: 2013-09-09 | Disposition: A | Discharge status: Home / Self Care | Payer: 59 | Source: Ambulatory Visit | Attending: Surgery | Admitting: Surgery

## 2013-09-09 DIAGNOSIS — E041 Nontoxic single thyroid nodule: Secondary | ICD-10-CM

## 2013-09-09 NOTE — Progress Notes (Signed)
Quick Note:  New nodule in left thyroid lobe. No sign of residual right thyroid lobe after lobectomy in 2000.  Arline Asp - please notify patient of the above and arrange percutaneous FNA biopsy of left thyroid nodule.  The patient and I discussed this possibility in the office.  Velora Heckler, MD, [facility] Office: [phone]   ______

## 2013-09-10 ENCOUNTER — Telehealth (INDEPENDENT_AMBULATORY_CARE_PROVIDER_SITE_OTHER): Payer: Self-pay

## 2013-09-10 ENCOUNTER — Other Ambulatory Visit (INDEPENDENT_AMBULATORY_CARE_PROVIDER_SITE_OTHER): Payer: Self-pay

## 2013-09-10 DIAGNOSIS — E041 Nontoxic single thyroid nodule: Secondary | ICD-10-CM

## 2013-09-10 NOTE — Progress Notes (Signed)
Order in epic and to ref coord to set up bx.

## 2013-09-10 NOTE — Telephone Encounter (Signed)
Order in epic and given to ref coord to call pt and set up bx.

## 2013-09-10 NOTE — Telephone Encounter (Signed)
Informed pt of Dr Ardine EngGerkin's recommendations. Pt states that she would like to do a bx. Informed pt that I would let Dr Gerrit FriendsGerkin and his nurse know. She would like to have these done on June 24 after 12:15 if at all possible

## 2013-09-10 NOTE — Telephone Encounter (Signed)
LMOM. Per Dr Gerrit FriendsGerkin a new area on left lobe of thyroid. Dr Gerrit FriendsGerkin suggests fna bx. Once pt aware we can set up bx. Order is in epic awaiting pt to be notified. See result note attached to ultrasound.

## 2013-09-12 ENCOUNTER — Encounter (INDEPENDENT_AMBULATORY_CARE_PROVIDER_SITE_OTHER): Payer: Self-pay | Admitting: Surgery

## 2013-09-13 ENCOUNTER — Telehealth (INDEPENDENT_AMBULATORY_CARE_PROVIDER_SITE_OTHER): Payer: Self-pay

## 2013-09-13 NOTE — Telephone Encounter (Signed)
Patient states she originally wanted bx done on Thursday but it does not matter now she is wanting it done ASAP . Informed her would pass this to the referral coordinator . She should be getting a call in a day or two

## 2013-09-15 ENCOUNTER — Ambulatory Visit
Admission: RE | Admit: 2013-09-15 | Discharge: 2013-09-15 | Disposition: A | Discharge status: Home / Self Care | Payer: 59 | Source: Ambulatory Visit | Attending: Surgery | Admitting: Surgery

## 2013-09-15 ENCOUNTER — Other Ambulatory Visit (HOSPITAL_COMMUNITY)
Admission: RE | Admit: 2013-09-15 | Discharge: 2013-09-15 | Disposition: A | Discharge status: Home / Self Care | Payer: 59 | Source: Ambulatory Visit | Attending: Interventional Radiology | Admitting: Interventional Radiology

## 2013-09-15 DIAGNOSIS — E041 Nontoxic single thyroid nodule: Secondary | ICD-10-CM

## 2013-09-16 ENCOUNTER — Telehealth (INDEPENDENT_AMBULATORY_CARE_PROVIDER_SITE_OTHER): Payer: Self-pay

## 2013-09-16 NOTE — Telephone Encounter (Signed)
Message copied by Joanette GulaSMITHEY, CYNTHIA on Thu Sep 16, 2013  3:12 PM ------      Message from: Velora HecklerGERKIN, TODD M      Created: Thu Sep 16, 2013  2:45 PM       Please contact patient and notify of benign pathology results.            Velora Hecklerodd M. Gerkin, MD, [facility]      [facility]      Office: [phone]             ------

## 2013-09-16 NOTE — Progress Notes (Signed)
Quick Note:  Please contact patient and notify of benign pathology results.  Marques Ericson M. Dillyn Menna, MD, FACS [facility] Office: [phone]   ______ 

## 2013-09-16 NOTE — Telephone Encounter (Signed)
Pt notified of path result per Dr Ardine EngGerkin's request. Will ask Dr Gerrit FriendsGerkin if pt needs to be put on recall for future imaging or ov.

## 2013-09-17 ENCOUNTER — Other Ambulatory Visit (INDEPENDENT_AMBULATORY_CARE_PROVIDER_SITE_OTHER): Payer: Self-pay

## 2013-09-17 ENCOUNTER — Telehealth (INDEPENDENT_AMBULATORY_CARE_PROVIDER_SITE_OTHER): Payer: Self-pay | Admitting: Surgery

## 2013-09-17 DIAGNOSIS — E042 Nontoxic multinodular goiter: Secondary | ICD-10-CM

## 2013-09-17 DIAGNOSIS — E041 Nontoxic single thyroid nodule: Secondary | ICD-10-CM

## 2013-09-17 NOTE — Telephone Encounter (Signed)
Called patient with path result - benign.  Will see in office in 6 months with repeat ultrasound the week before her office visit.  Velora Hecklerodd M. Gerkin, MD, [facility] Office: [phone]

## 2013-11-03 ENCOUNTER — Ambulatory Visit (INDEPENDENT_AMBULATORY_CARE_PROVIDER_SITE_OTHER): Payer: 59 | Admitting: Family Medicine

## 2013-11-03 ENCOUNTER — Ambulatory Visit (INDEPENDENT_AMBULATORY_CARE_PROVIDER_SITE_OTHER): Payer: 59

## 2013-11-03 ENCOUNTER — Encounter: Payer: Self-pay | Admitting: Family Medicine

## 2013-11-03 VITALS — BP 141/94 | HR 63 | Temp 98.5°F | Ht 70.0 in | Wt 156.0 lb

## 2013-11-03 DIAGNOSIS — R109 Unspecified abdominal pain: Secondary | ICD-10-CM

## 2013-11-03 DIAGNOSIS — E039 Hypothyroidism, unspecified: Secondary | ICD-10-CM

## 2013-11-03 DIAGNOSIS — R1010 Upper abdominal pain, unspecified: Secondary | ICD-10-CM

## 2013-11-03 DIAGNOSIS — E785 Hyperlipidemia, unspecified: Secondary | ICD-10-CM

## 2013-11-03 LAB — POCT CBC
GRANULOCYTE PERCENT: 59.8 % (ref 37–80)
HEMATOCRIT: 43.3 % (ref 37.7–47.9)
HEMOGLOBIN: 13.8 g/dL (ref 12.2–16.2)
LYMPH, POC: 1.8 (ref 0.6–3.4)
MCH: 29.2 pg (ref 27–31.2)
MCHC: 31.9 g/dL (ref 31.8–35.4)
MCV: 91.5 fL (ref 80–97)
MPV: 7.5 fL (ref 0–99.8)
PLATELET COUNT, POC: 303 10*3/uL (ref 142–424)
POC Granulocyte: 3 (ref 2–6.9)
POC LYMPH PERCENT: 35.7 %L (ref 10–50)
RBC: 4.7 M/uL (ref 4.04–5.48)
RDW, POC: 13.4 %
WBC: 5 10*3/uL (ref 4.6–10.2)

## 2013-11-03 NOTE — Patient Instructions (Signed)
Irritable Bowel Syndrome Irritable bowel syndrome (IBS) is caused by a disturbance of normal bowel function and is a common digestive disorder. You may also hear this condition called spastic colon, mucous colitis, and irritable colon. There is no cure for IBS. However, symptoms often gradually improve or disappear with a good diet, stress management, and medicine. This condition usually appears in late adolescence or early adulthood. Women develop it twice as often as men. CAUSES  After food has been digested and absorbed in the small intestine, waste material is moved into the large intestine, or colon. In the colon, water and salts are absorbed from the undigested products coming from the small intestine. The remaining residue, or fecal material, is held for elimination. Under normal circumstances, gentle, rhythmic contractions of the bowel walls push the fecal material along the colon toward the rectum. In IBS, however, these contractions are irregular and poorly coordinated. The fecal material is either retained too long, resulting in constipation, or expelled too soon, producing diarrhea. SIGNS AND SYMPTOMS  The most common symptom of IBS is abdominal pain. It is often in the lower left side of the abdomen, but it may occur anywhere in the abdomen. The pain comes from spasms of the bowel muscles happening too much and from the buildup of gas and fecal material in the colon. This pain:  Can range from sharp abdominal cramps to a dull, continuous ache.  Often worsens soon after eating.  Is often relieved by having a bowel movement or passing gas. Abdominal pain is usually accompanied by constipation, but it may also produce diarrhea. The diarrhea often occurs right after a meal or upon waking up in the morning. The stools are often soft, watery, and flecked with mucus. Other symptoms of IBS include:  Bloating.  Loss of appetite.  Heartburn.  Backache.  Dull pain in the arms or  shoulders.  Nausea.  Burping.  Vomiting.  Gas. IBS may also cause symptoms that are unrelated to the digestive system, such as:  Fatigue.  Headaches.  Anxiety.  Shortness of breath.  Trouble concentrating.  Dizziness. These symptoms tend to come and go. DIAGNOSIS  The symptoms of IBS may seem like symptoms of other, more serious digestive disorders. Your health care provider may want to perform tests to exclude these disorders.  TREATMENT Many medicines are available to help correct bowel function or relieve bowel spasms and abdominal pain. Among the medicines available are:  Laxatives for severe constipation and to help restore normal bowel habits.  Specific antidiarrheal medicines to treat severe or lasting diarrhea.  Antispasmodic agents to relieve intestinal cramps. Your health care provider may also decide to treat you with a mild tranquilizer or sedative during unusually stressful periods in your life. Your health care provider may also prescribe antidepressant medicine. The use of this medicine has been shown to reduce pain and other symptoms of IBS. Remember that if any medicine is prescribed for you, you should take it exactly as directed. Make sure your health care provider knows how well it worked for you. HOME CARE INSTRUCTIONS   Take all medicines as directed by your health care provider.  Avoid foods that are high in fat or oils, such as heavy cream, butter, frankfurters, sausage, and other fatty meats.  Avoid foods that make you go to the bathroom, such as fruit, fruit juice, and dairy products.  Cut out carbonated drinks, chewing gum, and "gassy" foods such as beans and cabbage. This may help relieve bloating and burping.    Eat foods with bran, and drink plenty of liquids with the bran foods. This helps relieve constipation.  Keep track of what foods seem to bring on your symptoms.  Avoid emotionally charged situations or circumstances that produce  anxiety.  Start or continue exercising.  Get plenty of rest and sleep. Document Released: 03/18/2005 Document Revised: 03/23/2013 Document Reviewed: 11/06/2007 Exit[employer] Patient Information 2015 [employer]. This information is not intended to replace advice given to you by your health care provider. Make sure you discuss any questions you have with your health care provider.  

## 2013-11-03 NOTE — Progress Notes (Signed)
   Subjective:    Patient ID: Maria [name], female    DOB: 03/18/1971, 43 y.o.   MRN: [id number]  HPI 43 year old female who presents today with right upper quadrant pain that began yesterday. She is status post cholecystectomy in 2007 or 8. She has had no nausea or vomiting. Change in position such as standing upright seems to increase her pain. She also has a history of irritable bowel syndrome and frequently has constipation but she is on multiple narcotics including fentanyl and oxycodone which contribute to her constipation she was able to sleep last night in a fetal position by history.    Review of Systems  Constitutional: Negative.   HENT: Negative.   Eyes: Negative.   Respiratory: Negative.   Cardiovascular: Negative.   Gastrointestinal: Abdominal pain: RUQ.  Endocrine: Negative.   Genitourinary: Negative.   Musculoskeletal: Positive for myalgias.       Bilateral thumb pain  Skin: Rash: not true rash but healing intertrigo.  Hematological: Negative.   Psychiatric/Behavioral: Negative.        Objective:   Physical Exam  Constitutional: She is oriented to person, place, and time. She appears well-developed and well-nourished.  Eyes: Conjunctivae and EOM are normal.  Neck: Normal range of motion. Neck supple.  Cardiovascular: Normal rate, regular rhythm and normal heart sounds.   Pulmonary/Chest: Effort normal and breath sounds normal.  Abdominal: Soft. Bowel sounds are normal. There is tenderness (mildly tender; nl BS).  Musculoskeletal: Normal range of motion.  Neurological: She is alert and oriented to person, place, and time. She has normal reflexes.  Skin: Skin is warm and dry.  Psychiatric: She has a normal mood and affect. Her behavior is normal. Thought content normal.          Assessment & Plan:  1. Upper abdominal pain KUB shows increased gas in LUQ, suggesting splenic flexure syndrome.  WBC id normal at 5000; this with absence of fever rules out  appendicitis.  I suspect this gas is the etiology of her pain.  I have suggested Senna-S since she is on no bowel regimen and should be on the narcotics and with IBS - DG Abd 1 View; Future - POCT CBC  2. Hypothyroidism, unspecified hypothyroidism type   3. Hyperlipidemia Frederica KusterStephen M Baylin Gamblin MD

## 2013-11-16 ENCOUNTER — Ambulatory Visit (INDEPENDENT_AMBULATORY_CARE_PROVIDER_SITE_OTHER): Payer: 59 | Admitting: Nurse Practitioner

## 2013-11-16 ENCOUNTER — Encounter: Payer: Self-pay | Admitting: Nurse Practitioner

## 2013-11-16 VITALS — BP 142/87 | HR 63 | Temp 98.8°F | Wt 157.8 lb

## 2013-11-16 DIAGNOSIS — N390 Urinary tract infection, site not specified: Secondary | ICD-10-CM

## 2013-11-16 LAB — POCT UA - MICROSCOPIC ONLY
CASTS, UR, LPF, POC: NEGATIVE
CRYSTALS, UR, HPF, POC: NEGATIVE
Yeast, UA: NEGATIVE

## 2013-11-16 LAB — POCT URINALYSIS DIPSTICK

## 2013-11-16 MED ORDER — CIPROFLOXACIN HCL 500 MG PO TABS: 500.0000 mg | ORAL_TABLET | Freq: Two times a day (BID) | ORAL | Status: DC

## 2013-11-16 NOTE — Patient Instructions (Signed)

## 2013-11-16 NOTE — Progress Notes (Signed)
   Subjective:    Patient ID: Maria [name], female    DOB: 02/05/1971, 43 y.o.   MRN: [id number]  HPI  Patient in today c/o bladder infection- C/o urinary frequency and urgency- Low back pain- vomiting and diarrhea. SHe has been taking macrobid which did not help- AZO no help.    Review of Systems  Constitutional: Negative.   HENT: Negative.   Cardiovascular: Negative.   Genitourinary: Positive for dysuria, urgency, flank pain and pelvic pain.  Neurological: Negative.   Psychiatric/Behavioral: Negative.   All other systems reviewed and are negative.      Objective:   Physical Exam  Constitutional: She is oriented to person, place, and time. She appears well-developed and well-nourished.  Cardiovascular: Normal rate, regular rhythm and normal heart sounds.   Pulmonary/Chest: Effort normal and breath sounds normal.  Abdominal: Soft. Bowel sounds are normal. There is tenderness (suprapubic pain on palpation).  Genitourinary:  CVA tenderness bil  Neurological: She is alert and oriented to person, place, and time.  Skin: Skin is warm and dry.  Psychiatric: She has a normal mood and affect. Her behavior is normal. Judgment and thought content normal.   BP 142/87  Pulse 63  Temp(Src) 98.8 F (37.1 C) (Oral)  Wt 157 lb 12.8 oz (71.578 kg) Results for orders placed in visit on 11/16/13  POCT UA - MICROSCOPIC ONLY      Result Value Ref Range   WBC, Ur, HPF, POC 10-15     RBC, urine, microscopic 10-12     Bacteria, U Microscopic moderate     Mucus, UA moderate     Epithelial cells, urine per micros few     Crystals, Ur, HPF, POC negative     Casts, Ur, LPF, POC negative     Yeast, UA negative    POCT URINALYSIS DIPSTICK      Result Value Ref Range   Color, UA red     Clarity, UA clear             Assessment & Plan:   1. Urinary tract infection, site not specified    Meds ordered this encounter  Medications  . ciprofloxacin (CIPRO) 500 MG tablet    Sig: Take 1  tablet (500 mg total) by mouth 2 (two) times daily.    Dispense:  20 tablet    Refill:  0    Order Specific Question:  Supervising Provider    Answer:  Ernestina PennaMOORE, DONALD W [[id number]]  urine culture pending Force fluids Rest May continue AZO as needed  Mary-Margaret Daphine DeutscherMartin, FNP

## 2013-11-19 ENCOUNTER — Other Ambulatory Visit: Payer: Self-pay | Admitting: Nurse Practitioner

## 2013-11-19 LAB — URINE CULTURE

## 2013-11-19 MED ORDER — NITROFURANTOIN MACROCRYSTAL 100 MG PO CAPS: 100.0000 mg | ORAL_CAPSULE | Freq: Four times a day (QID) | ORAL | Status: DC

## 2014-06-01 ENCOUNTER — Telehealth: Payer: Self-pay | Admitting: Nurse Practitioner

## 2014-06-01 NOTE — Telephone Encounter (Signed)
Stp her BP's have been elevated. At the doctor office yesterday it was 179/110 and at home it has been 150's/100's. She was previously on BP meds but lost a lot of weight and didn't need them anymore. Her husband has an appt tomorrow at 10:30 with you. Pt wants to know if she can just come in with him or do you want her to schedule an appt. Please advise.

## 2014-06-02 ENCOUNTER — Encounter: Payer: Self-pay | Admitting: Nurse Practitioner

## 2014-06-02 ENCOUNTER — Ambulatory Visit (INDEPENDENT_AMBULATORY_CARE_PROVIDER_SITE_OTHER): Payer: 59 | Admitting: Nurse Practitioner

## 2014-06-02 ENCOUNTER — Ambulatory Visit (INDEPENDENT_AMBULATORY_CARE_PROVIDER_SITE_OTHER): Payer: 59

## 2014-06-02 VITALS — BP 149/95 | HR 78 | Temp 97.5°F | Ht 70.0 in | Wt 160.0 lb

## 2014-06-02 DIAGNOSIS — F32A Depression, unspecified: Secondary | ICD-10-CM

## 2014-06-02 DIAGNOSIS — I10 Essential (primary) hypertension: Secondary | ICD-10-CM

## 2014-06-02 DIAGNOSIS — F329 Major depressive disorder, single episode, unspecified: Secondary | ICD-10-CM | POA: Diagnosis not present

## 2014-06-02 MED ORDER — BENAZEPRIL HCL 20 MG PO TABS: 20.0000 mg | ORAL_TABLET | Freq: Every day | ORAL | Status: DC

## 2014-06-02 MED ORDER — ESCITALOPRAM OXALATE 10 MG PO TABS
10.0000 mg | ORAL_TABLET | Freq: Every day | ORAL | Status: DC
Start: 2014-06-02 — End: 2014-12-14

## 2014-06-02 NOTE — Addendum Note (Signed)
Addended by: Bennie PieriniMARTIN, MARY-MARGARET on: 06/02/2014 12:05 PM   Modules accepted: Orders

## 2014-06-02 NOTE — Progress Notes (Addendum)
   Subjective:    Patient ID: Maria [name], female    DOB: 05-23-1970, 44 y.o.   MRN: [id number]  HPI Patient in today to recheck blood pressure- SHe has lost a lot of weight over the years and we actually were able to stop her blood pressure meds. Now it is going up and staying up. SHe is having occassional headache.    Review of Systems  Constitutional: Negative.   HENT: Negative.   Respiratory: Negative.  Negative for shortness of breath.   Cardiovascular: Negative.  Negative for chest pain and leg swelling.  Gastrointestinal: Negative.   Genitourinary: Negative.   Neurological: Negative for dizziness and headaches.  Psychiatric/Behavioral: Negative.   All other systems reviewed and are negative.      Objective:   Physical Exam  Constitutional: She is oriented to person, place, and time. She appears well-developed and well-nourished.  Cardiovascular: Normal rate and normal heart sounds.   Pulmonary/Chest: Effort normal and breath sounds normal.  Abdominal: Soft. Bowel sounds are normal.  Neurological: She is alert and oriented to person, place, and time.  Skin: Skin is warm and dry.  Psychiatric: She has a normal mood and affect. Her behavior is normal. Judgment and thought content normal.    BP 149/95 mmHg  Pulse 78  Temp(Src) 97.5 F (36.4 C) (Oral)  Ht $R'5\' 10"'Pk$  (1.778 m)  Wt 160 lb (72.576 kg)  BMI 22.96 kg/m2  Maria Nissen, FNP Chest xray- normal-Preliminary reading by Maria Collum, FNP  [facility]      Assessment & Plan:  1. Essential hypertension Do not add slt to diet Keep diary of blood pressure - benazepril (LOTENSIN) 20 MG tablet; Take 1 tablet (20 mg total) by mouth daily.  Dispense: 90 tablet; Refill: 1 - DG Chest 2 View; Future - EKG 12-Lead - CMP14+EGFR - NMR, lipoprofile  2. Depression Stress management lexapro 10 mg 1 po qd #30 5 refills   Labs pending Health maintenance reviewed Diet and exercise encouraged Continue all  meds Follow up  In 6 months   [place], FNP

## 2014-06-02 NOTE — Addendum Note (Signed)
Addended by: Prescott GumLAND, Hedy Garro M on: 06/02/2014 11:56 AM   Modules accepted: Kipp BroodSmartSet

## 2014-06-03 LAB — NMR, LIPOPROFILE
CHOLESTEROL: 193 mg/dL (ref 100–199)
HDL CHOLESTEROL BY NMR: 49 mg/dL (ref >39)
HDL PARTICLE NUMBER: 26.2 umol/L — AB (ref >=30.5)
LDL Particle Number: 1281 nmol/L — ABNORMAL HIGH (ref <1000)
LDL SIZE: 21.7 nm (ref >20.5)
LDL-C: 115 mg/dL — ABNORMAL HIGH (ref 0–99)
LP-IR Score: 39 (ref <=45)
SMALL LDL PARTICLE NUMBER: 289 nmol/L (ref <=527)
TRIGLYCERIDES BY NMR: 147 mg/dL (ref 0–149)

## 2014-06-03 LAB — CMP14+EGFR
ALBUMIN: 4.1 g/dL (ref 3.5–5.5)
ALK PHOS: 74 IU/L (ref 39–117)
ALT: 10 IU/L (ref 0–32)
AST: 78 IU/L — ABNORMAL HIGH (ref 0–40)
Albumin/Globulin Ratio: 1.8 (ref 1.1–2.5)
BILIRUBIN TOTAL: 0.3 mg/dL (ref 0.0–1.2)
BUN/Creatinine Ratio: 8 — ABNORMAL LOW (ref 9–23)
BUN: 5 mg/dL — AB (ref 6–24)
CHLORIDE: 99 mmol/L (ref 97–108)
CO2: 28 mmol/L (ref 18–29)
Calcium: 9.3 mg/dL (ref 8.7–10.2)
Creatinine, Ser: 0.64 mg/dL (ref 0.57–1.00)
GFR calc non Af Amer: 108 mL/min/{1.73_m2} (ref >59)
GFR, EST AFRICAN AMERICAN: 125 mL/min/{1.73_m2} (ref >59)
GLUCOSE: 99 mg/dL (ref 65–99)
Globulin, Total: 2.3 g/dL (ref 1.5–4.5)
Potassium: 3.8 mmol/L (ref 3.5–5.2)
Sodium: 139 mmol/L (ref 134–144)
Total Protein: 6.4 g/dL (ref 6.0–8.5)

## 2014-06-10 ENCOUNTER — Telehealth: Payer: Self-pay | Admitting: Nurse Practitioner

## 2014-06-10 MED ORDER — LISINOPRIL-HYDROCHLOROTHIAZIDE 20-12.5 MG PO TABS: 2.0000 | ORAL_TABLET | Freq: Every day | ORAL | Status: DC

## 2014-06-10 NOTE — Telephone Encounter (Signed)
Lisinopril changed to 20/12.5     2 a day

## 2014-06-10 NOTE — Telephone Encounter (Signed)
Pt states she is taking Benazepril 20mg  qday at 4:30. She has still been getting BP readings of 150's-190's/110's. What would you like her to do?

## 2014-06-10 NOTE — Telephone Encounter (Signed)
Pt aware.

## 2014-06-15 ENCOUNTER — Telehealth: Payer: Self-pay | Admitting: Nurse Practitioner

## 2014-06-16 MED ORDER — AMLODIPINE BESY-BENAZEPRIL HCL 10-20 MG PO CAPS: 1.0000 | ORAL_CAPSULE | Freq: Every day | ORAL | Status: DC

## 2014-06-16 NOTE — Telephone Encounter (Signed)
Changed lisinopril to lotrel-keep watch of blood pressure

## 2014-06-16 NOTE — Telephone Encounter (Addendum)
Patient aware of results and medication change

## 2014-06-16 NOTE — Telephone Encounter (Signed)
Stp and she states before when she was on lotrel and lasix she never had any of these symptoms.Do you want to change her meds again or does she ntbs?

## 2014-08-15 ENCOUNTER — Other Ambulatory Visit: Payer: Self-pay | Admitting: Nurse Practitioner

## 2014-10-20 ENCOUNTER — Telehealth: Payer: Self-pay | Admitting: Nurse Practitioner

## 2014-10-20 MED ORDER — NITROFURANTOIN MONOHYD MACRO 100 MG PO CAPS: 100.0000 mg | ORAL_CAPSULE | Freq: Two times a day (BID) | ORAL | Status: DC

## 2014-10-20 NOTE — Telephone Encounter (Signed)
macrobid sent to pharmacy 

## 2014-10-20 NOTE — Telephone Encounter (Signed)
Urgency and horrible burning   On AZO and pushing fluids, but not getting better.  No fever or back pain. [facility] [place].

## 2014-12-14 ENCOUNTER — Ambulatory Visit (INDEPENDENT_AMBULATORY_CARE_PROVIDER_SITE_OTHER): Payer: Commercial Managed Care - HMO | Admitting: Nurse Practitioner

## 2014-12-14 ENCOUNTER — Encounter: Payer: Self-pay | Admitting: Nurse Practitioner

## 2014-12-14 VITALS — BP 122/78 | HR 62 | Temp 97.5°F | Ht 70.0 in | Wt 139.0 lb

## 2014-12-14 DIAGNOSIS — R634 Abnormal weight loss: Secondary | ICD-10-CM

## 2014-12-14 DIAGNOSIS — N390 Urinary tract infection, site not specified: Secondary | ICD-10-CM

## 2014-12-14 DIAGNOSIS — M549 Dorsalgia, unspecified: Secondary | ICD-10-CM | POA: Diagnosis not present

## 2014-12-14 DIAGNOSIS — I10 Essential (primary) hypertension: Secondary | ICD-10-CM | POA: Diagnosis not present

## 2014-12-14 DIAGNOSIS — Z23 Encounter for immunization: Secondary | ICD-10-CM | POA: Diagnosis not present

## 2014-12-14 DIAGNOSIS — G8929 Other chronic pain: Secondary | ICD-10-CM

## 2014-12-14 DIAGNOSIS — G4700 Insomnia, unspecified: Secondary | ICD-10-CM | POA: Diagnosis not present

## 2014-12-14 DIAGNOSIS — F329 Major depressive disorder, single episode, unspecified: Secondary | ICD-10-CM

## 2014-12-14 DIAGNOSIS — F32A Depression, unspecified: Secondary | ICD-10-CM

## 2014-12-14 DIAGNOSIS — E785 Hyperlipidemia, unspecified: Secondary | ICD-10-CM | POA: Diagnosis not present

## 2014-12-14 DIAGNOSIS — E039 Hypothyroidism, unspecified: Secondary | ICD-10-CM | POA: Diagnosis not present

## 2014-12-14 MED ORDER — NITROFURANTOIN MACROCRYSTAL 100 MG PO CAPS: 100.0000 mg | ORAL_CAPSULE | Freq: Four times a day (QID) | ORAL | Status: DC

## 2014-12-14 MED ORDER — ESCITALOPRAM OXALATE 10 MG PO TABS: 10.0000 mg | ORAL_TABLET | Freq: Every day | ORAL | Status: DC

## 2014-12-14 MED ORDER — AMLODIPINE BESY-BENAZEPRIL HCL 10-20 MG PO CAPS: 1.0000 | ORAL_CAPSULE | Freq: Every day | ORAL | Status: DC

## 2014-12-14 NOTE — Patient Instructions (Signed)
High Protein, High Calorie Diet A high protein, high calorie diet increases the amount of protein and calories you eat. You may need more protein and calories in your diet because of illness, surgery, injury, weight loss, or having a poor appetite. Eating high protein and high calorie foods can help you gain weight, heal, and recover after illness.  SERVING SIZES Measuring foods and serving sizes helps to make sure you are getting the right amount of food. The list below tells how big or small some common serving sizes are.   1 oz.........4 stacked dice.  3 oz.........Deck of cards.  1 tsp........Tip of little finger.  1 tbs........Thumb.  2 tbs........Golf ball.   cup.......Half of a fist.  1 cup........A fist. HIGH PROTEIN FOODS Dairy  Whole milk.  Whole milk yogurt.  Powdered milk.  Cheese.  Cottage Cheese.  Instant breakfast products.  Eggnog. Tips for adding to your diet:  Use whole milk when making hot cereal, puddings, soups, and hot cocoa.  Add powdered milk to baked goods, smoothies, and milkshakes.  Make whole milk yogurt parfaits by adding granola, fruit, or nuts.  Add cheese to sandwiches, pastas, soups, and casseroles.  Add fruit to cottage cheese. Meat   Beef, pork, and poultry.  Fish and seafood.  Peanut butter.  Dried beans.  Eggs. Tips for adding to your diet:  Make meat and cheese omelets.  Add eggs to salads and baked goods.  Add fish and seafood to salads.  Add meat and poultry to casseroles, salads, and soups.  Use peanut butter as a topping for pretzels, celery, crackers, or add it to baked goods.  Use beans in casseroles, dips, and spreads. GENERAL GUIDELINES TO INCREASE CALORIES  Replace calorie-free drinks with calorie-containing drinks, such as milk, fruit juices, regular soda, milkshakes, and hot chocolate.  Try to eat 6 small meals instead of 3 large meals each day.  Keep snacks handy, such as nuts, trail mixes,  dried fruit, and yogurt.  Choose foods with sauces and gravies.  Add dried fruits, honey, and half-and-half to hot or cold cereal.  Add extra fats when possible, such as butter, sour cream, cream cheese, and salad dressings.  Add cheese to foods often.  Consider adding a clear liquid nutritional supplement to your diet. Your caregiver can give you recommendations. HIGH CALORIE FOODS Grain/Starch  Baked goods, such as muffins and quick breads.  Croissants.  Pancakes and waffles. Vegetable   Sauted vegetables in oil.  Fried vegetables.  Salad greens with regular salad dressing or vinegar and oil. Fruit  Dried fruit.  Canned fruit in syrup.  Fruit juice. Fat  Avocado.  Butter or margarine.  Whipped cream.  Mayonnaise.  Salad dressing.  Peanuts and mixed nuts.  Cream cheese and sour cream. Sweets and Dessert  Cake.  Cookies.  Pie.  Ice cream.  Doughnuts and pastries.  Protein and meal replacement bars.  Jam, preserves, and jelly.  Candy bars.  Chocolate.  Chocolate, caramel, or other flavored syrups. Document Released: 03/18/2005 Document Revised: 06/10/2011 Document Reviewed: 12/19/2006 Exit[employer] Patient Information 2015 [employer]. This information is not intended to replace advice given to you by your health care provider. Make sure you discuss any questions you have with your health care provider.  

## 2014-12-14 NOTE — Progress Notes (Signed)
Subjective:    Patient ID: Maria [name], female    DOB: Sep 13, 1970, 44 y.o.   MRN: [id number]   Patient here today for follow up of chronic medical problems.  * She has lost a lot of weight over the last year ( down 56lbs )  and says that she just can't eat. Says that stomach gets real upset and she just doesn't want to eat.  Hypertension This is a chronic problem. The current episode started more than 1 year ago. The problem is controlled. Pertinent negatives include no chest pain, headaches or shortness of breath. Past treatments include ACE inhibitors and calcium channel blockers. The current treatment provides moderate improvement. There are no compliance problems.  Hypertensive end-organ damage includes a thyroid problem. There is no history of CAD/MI, CVA or retinopathy.  Hyperlipidemia This is a chronic problem. The current episode started more than 1 year ago. Recent lipid tests were reviewed and are variable. Pertinent negatives include no chest pain or shortness of breath. She is currently on no antihyperlipidemic treatment. There are no compliance problems.  Risk factors for coronary artery disease include dyslipidemia and hypertension.  Thyroid Problem Presents for follow-up (hypothyroidism) visit. The symptoms have been stable. Her past medical history is significant for hyperlipidemia.  depression  Stopped her lexapro and just recently starting taking again 2 days ago. She just feels stressed all the time. Chronic back pain Currently on fentanyl patch 16mg and percocet 10/325 daily- she is currently going to [facility]. Insomnia Currently not taking anything- has trouble on occasion resting UTI after intercourse Keeps macrobid on hand when needed and ususlaly takes 1 after intercourse to prevent UTI  Review of Systems  Constitutional: Negative.   HENT: Negative.   Respiratory: Negative.  Negative for shortness of breath.   Cardiovascular: Negative.   Negative for chest pain and leg swelling.  Gastrointestinal: Negative.   Genitourinary: Negative.   Neurological: Negative for dizziness and headaches.  Psychiatric/Behavioral: Negative.   All other systems reviewed and are negative.      Objective:   Physical Exam  Constitutional: She is oriented to person, place, and time. She appears well-developed and well-nourished.  Cardiovascular: Normal rate and normal heart sounds.   Pulmonary/Chest: Effort normal and breath sounds normal.  Abdominal: Soft. Bowel sounds are normal.  Neurological: She is alert and oriented to person, place, and time.  Skin: Skin is warm and dry.  Psychiatric: She has a normal mood and affect. Her behavior is normal. Judgment and thought content normal.    BP 122/78 mmHg  Pulse 62  Temp(Src) 97.5 F (36.4 C) (Oral)  Ht 5' 10" (1.778 m)  Wt 139 lb (63.05 kg)  BMI 19.94 kg/m2      Assessment & Plan:  1. Essential hypertension Do not add salt to diet - CMP14+EGFR - amLODipine-benazepril (LOTREL) 10-20 MG per capsule; Take 1 capsule by mouth daily.  Dispense: 30 capsule; Refill: 5  2. Hypothyroidism, unspecified hypothyroidism type - Thyroid Panel With TSH  3. Hyperlipidemia Low fat diet - Lipid panel  4. Depression Stress management - escitalopram (LEXAPRO) 10 MG tablet; Take 1 tablet (10 mg total) by mouth daily.  Dispense: 30 tablet; Refill: 5  5. Chronic back pain Continue to pain management  6. Insomnia Bedtime ritual  7. Loss of weight Add ensure or boost to diet - Anemia Profile B - Ambulatory referral to Gastroenterology  8. Frequent UTI - nitrofurantoin (MACRODANTIN) 100 MG capsule; Take 1 capsule (100  mg total) by mouth 4 (four) times daily.  Dispense: 20 capsule; Refill: 0   Needs to schedule pap Labs pending Health maintenance reviewed Diet and exercise encouraged Continue all meds Follow up  In 6 months   [place], FNP

## 2014-12-15 NOTE — Addendum Note (Signed)
Addended by: Cleda Daub on: 12/15/2014 09:49 AM   Modules accepted: Orders

## 2014-12-16 LAB — THYROID PANEL WITH TSH
Free Thyroxine Index: 1.9 (ref 1.2–4.9)
T3 Uptake Ratio: 26 % (ref 24–39)
T4, Total: 7.3 ug/dL (ref 4.5–12.0)
TSH: 0.58 u[IU]/mL (ref 0.450–4.500)

## 2014-12-16 LAB — CMP14+EGFR
ALK PHOS: 82 IU/L (ref 39–117)
ALT: 7 IU/L (ref 0–32)
AST: 9 IU/L (ref 0–40)
Albumin/Globulin Ratio: 1.7 (ref 1.1–2.5)
Albumin: 3.9 g/dL (ref 3.5–5.5)
BILIRUBIN TOTAL: 0.3 mg/dL (ref 0.0–1.2)
BUN/Creatinine Ratio: 16 (ref 9–23)
BUN: 11 mg/dL (ref 6–24)
CHLORIDE: 100 mmol/L (ref 97–108)
CO2: 26 mmol/L (ref 18–29)
CREATININE: 0.68 mg/dL (ref 0.57–1.00)
Calcium: 9.1 mg/dL (ref 8.7–10.2)
GFR calc Af Amer: 122 mL/min/{1.73_m2} (ref >59)
GFR calc non Af Amer: 106 mL/min/{1.73_m2} (ref >59)
GLOBULIN, TOTAL: 2.3 g/dL (ref 1.5–4.5)
Glucose: 93 mg/dL (ref 65–99)
Potassium: 3.3 mmol/L — ABNORMAL LOW (ref 3.5–5.2)
SODIUM: 142 mmol/L (ref 134–144)
TOTAL PROTEIN: 6.2 g/dL (ref 6.0–8.5)

## 2014-12-16 LAB — LIPID PANEL
CHOLESTEROL TOTAL: 171 mg/dL (ref 100–199)
Chol/HDL Ratio: 3.7 ratio units (ref 0.0–4.4)
HDL: 46 mg/dL (ref >39)
LDL Calculated: 108 mg/dL — ABNORMAL HIGH (ref 0–99)
TRIGLYCERIDES: 87 mg/dL (ref 0–149)
VLDL CHOLESTEROL CAL: 17 mg/dL (ref 5–40)

## 2014-12-16 LAB — ANEMIA PROFILE B
BASOS: 1 %
Basophils Absolute: 0.1 10*3/uL (ref 0.0–0.2)
EOS (ABSOLUTE): 0.3 10*3/uL (ref 0.0–0.4)
Eos: 4 %
FERRITIN: 61 ng/mL (ref 15–150)
FOLATE: 2.4 ng/mL — AB (ref >3.0)
HEMATOCRIT: 38.4 % (ref 34.0–46.6)
Hemoglobin: 12.9 g/dL (ref 11.1–15.9)
IRON SATURATION: 26 % (ref 15–55)
Immature Grans (Abs): 0 10*3/uL (ref 0.0–0.1)
Immature Granulocytes: 0 %
Iron: 71 ug/dL (ref 27–159)
LYMPHS ABS: 1.7 10*3/uL (ref 0.7–3.1)
Lymphs: 22 %
MCH: 30.8 pg (ref 26.6–33.0)
MCHC: 33.6 g/dL (ref 31.5–35.7)
MCV: 92 fL (ref 79–97)
Monocytes Absolute: 0.4 10*3/uL (ref 0.1–0.9)
Monocytes: 6 %
NEUTROS ABS: 5.3 10*3/uL (ref 1.4–7.0)
Neutrophils: 67 %
PLATELETS: 332 10*3/uL (ref 150–379)
RBC: 4.19 x10E6/uL (ref 3.77–5.28)
RDW: 14.6 % (ref 12.3–15.4)
Retic Ct Pct: 0.9 % (ref 0.6–2.6)
TIBC: 273 ug/dL (ref 250–450)
UIBC: 202 ug/dL (ref 131–425)
Vitamin B-12: 463 pg/mL (ref 211–946)
WBC: 7.7 10*3/uL (ref 3.4–10.8)

## 2014-12-26 ENCOUNTER — Telehealth: Payer: Self-pay | Admitting: Nurse Practitioner

## 2014-12-26 NOTE — Telephone Encounter (Signed)
Appt given for next week per patient request. Offered appt with another provider this week but patient wants to see MMM

## 2015-01-03 ENCOUNTER — Emergency Department (HOSPITAL_COMMUNITY)
Admission: EM | Admit: 2015-01-03 | Discharge: 2015-01-04 | Disposition: A | Discharge status: Home / Self Care | Payer: Commercial Managed Care - HMO | Attending: Emergency Medicine | Admitting: Emergency Medicine

## 2015-01-03 ENCOUNTER — Encounter (HOSPITAL_COMMUNITY): Payer: Self-pay | Admitting: Emergency Medicine

## 2015-01-03 ENCOUNTER — Ambulatory Visit: Payer: Commercial Managed Care - HMO | Admitting: Nurse Practitioner

## 2015-01-03 DIAGNOSIS — Z79899 Other long term (current) drug therapy: Secondary | ICD-10-CM | POA: Diagnosis not present

## 2015-01-03 DIAGNOSIS — G43A0 Cyclical vomiting, not intractable: Secondary | ICD-10-CM | POA: Insufficient documentation

## 2015-01-03 DIAGNOSIS — R109 Unspecified abdominal pain: Secondary | ICD-10-CM

## 2015-01-03 DIAGNOSIS — R1110 Vomiting, unspecified: Secondary | ICD-10-CM

## 2015-01-03 DIAGNOSIS — G8929 Other chronic pain: Secondary | ICD-10-CM | POA: Insufficient documentation

## 2015-01-03 DIAGNOSIS — Z87440 Personal history of urinary (tract) infections: Secondary | ICD-10-CM | POA: Insufficient documentation

## 2015-01-03 DIAGNOSIS — I10 Essential (primary) hypertension: Secondary | ICD-10-CM | POA: Diagnosis not present

## 2015-01-03 DIAGNOSIS — Z720 Tobacco use: Secondary | ICD-10-CM | POA: Diagnosis not present

## 2015-01-03 DIAGNOSIS — Z8639 Personal history of other endocrine, nutritional and metabolic disease: Secondary | ICD-10-CM | POA: Diagnosis not present

## 2015-01-03 DIAGNOSIS — Z3202 Encounter for pregnancy test, result negative: Secondary | ICD-10-CM | POA: Diagnosis not present

## 2015-01-03 LAB — COMPREHENSIVE METABOLIC PANEL
ALBUMIN: 4 g/dL (ref 3.5–5.0)
ALK PHOS: 70 U/L (ref 38–126)
ALT: 13 U/L — AB (ref 14–54)
AST: 22 U/L (ref 15–41)
Anion gap: 12 (ref 5–15)
BUN: 10 mg/dL (ref 6–20)
CALCIUM: 9.4 mg/dL (ref 8.9–10.3)
CHLORIDE: 108 mmol/L (ref 101–111)
CO2: 19 mmol/L — AB (ref 22–32)
CREATININE: 0.64 mg/dL (ref 0.44–1.00)
GFR calc non Af Amer: >60 mL/min (ref >60)
GLUCOSE: 145 mg/dL — AB (ref 65–99)
Potassium: 3 mmol/L — ABNORMAL LOW (ref 3.5–5.1)
SODIUM: 139 mmol/L (ref 135–145)
Total Bilirubin: 0.7 mg/dL (ref 0.3–1.2)
Total Protein: 6.6 g/dL (ref 6.5–8.1)

## 2015-01-03 LAB — CBC WITH DIFFERENTIAL/PLATELET
BASOS ABS: 0 10*3/uL (ref 0.0–0.1)
BASOS PCT: 0 %
EOS ABS: 0.6 10*3/uL (ref 0.0–0.7)
EOS PCT: 4 %
HCT: 39.4 % (ref 36.0–46.0)
HEMOGLOBIN: 13.4 g/dL (ref 12.0–15.0)
LYMPHS ABS: 1.1 10*3/uL (ref 0.7–4.0)
Lymphocytes Relative: 8 %
MCH: 30.2 pg (ref 26.0–34.0)
MCHC: 34 g/dL (ref 30.0–36.0)
MCV: 88.9 fL (ref 78.0–100.0)
Monocytes Absolute: 0.7 10*3/uL (ref 0.1–1.0)
Monocytes Relative: 5 %
NEUTROS PCT: 83 %
Neutro Abs: 11.4 10*3/uL — ABNORMAL HIGH (ref 1.7–7.7)
PLATELETS: 318 10*3/uL (ref 150–400)
RBC: 4.43 MIL/uL (ref 3.87–5.11)
RDW: 12.6 % (ref 11.5–15.5)
WBC: 13.8 10*3/uL — AB (ref 4.0–10.5)

## 2015-01-03 MED ORDER — POTASSIUM CHLORIDE CRYS ER 20 MEQ PO TBCR
60.0000 meq | EXTENDED_RELEASE_TABLET | Freq: Once | ORAL | Status: DC
  Filled 2015-01-03: qty 3

## 2015-01-03 MED ORDER — ONDANSETRON 4 MG PO TBDP
ORAL_TABLET | ORAL | Status: AC
  Filled 2015-01-03: qty 1

## 2015-01-03 MED ORDER — ONDANSETRON 4 MG PO TBDP
4.0000 mg | ORAL_TABLET | Freq: Once | ORAL | Status: AC
  Administered 2015-01-03: 4 mg via ORAL

## 2015-01-03 NOTE — ED Notes (Signed)
Pt c/o generalized abd pain onset 3 hours ago.  Also c/o nausea and vomiting.  Pt is currently wearing a Fentanyl patch and st's she took Percocet prior to coming to ED

## 2015-01-04 ENCOUNTER — Encounter (HOSPITAL_COMMUNITY): Payer: Self-pay | Admitting: Radiology

## 2015-01-04 ENCOUNTER — Emergency Department (HOSPITAL_COMMUNITY): Payer: Commercial Managed Care - HMO

## 2015-01-04 LAB — URINALYSIS, ROUTINE W REFLEX MICROSCOPIC
BILIRUBIN URINE: NEGATIVE
GLUCOSE, UA: NEGATIVE mg/dL
Hgb urine dipstick: NEGATIVE
KETONES UR: 40 mg/dL — AB
NITRITE: POSITIVE — AB
PH: 8 (ref 5.0–8.0)
Protein, ur: 30 mg/dL — AB
Specific Gravity, Urine: 1.027 (ref 1.005–1.030)
Urobilinogen, UA: 0.2 mg/dL (ref 0.0–1.0)

## 2015-01-04 LAB — URINE MICROSCOPIC-ADD ON

## 2015-01-04 LAB — LIPASE, BLOOD: LIPASE: 45 U/L (ref 22–51)

## 2015-01-04 LAB — POC URINE PREG, ED: Preg Test, Ur: NEGATIVE

## 2015-01-04 MED ORDER — PROMETHAZINE HCL 25 MG RE SUPP: 25.0000 mg | Freq: Four times a day (QID) | RECTAL | Status: DC | PRN

## 2015-01-04 MED ORDER — HYDROMORPHONE HCL 1 MG/ML IJ SOLN
1.0000 mg | Freq: Once | INTRAMUSCULAR | Status: AC
  Administered 2015-01-04: 1 mg via INTRAVENOUS
  Filled 2015-01-04: qty 1

## 2015-01-04 MED ORDER — SODIUM CHLORIDE 0.9 % IV BOLUS (SEPSIS)
1000.0000 mL | Freq: Once | INTRAVENOUS | Status: AC
  Administered 2015-01-04: 1000 mL via INTRAVENOUS

## 2015-01-04 MED ORDER — PROMETHAZINE HCL 25 MG/ML IJ SOLN
12.5000 mg | Freq: Once | INTRAMUSCULAR | Status: AC
  Administered 2015-01-04: 12.5 mg via INTRAVENOUS
  Filled 2015-01-04: qty 1

## 2015-01-04 MED ORDER — ONDANSETRON HCL 4 MG/2ML IJ SOLN
4.0000 mg | Freq: Once | INTRAMUSCULAR | Status: AC
  Administered 2015-01-04: 4 mg via INTRAVENOUS
  Filled 2015-01-04: qty 2

## 2015-01-04 MED ORDER — POTASSIUM CHLORIDE 10 MEQ/100ML IV SOLN
10.0000 meq | INTRAVENOUS | Status: AC
  Administered 2015-01-04 (×2): 10 meq via INTRAVENOUS
  Filled 2015-01-04 (×2): qty 100

## 2015-01-04 MED ORDER — IOHEXOL 300 MG/ML  SOLN
100.0000 mL | Freq: Once | INTRAMUSCULAR | Status: AC | PRN
  Administered 2015-01-04: 100 mL via INTRAVENOUS

## 2015-01-04 MED ORDER — CEPHALEXIN 500 MG PO CAPS: 500.0000 mg | ORAL_CAPSULE | Freq: Two times a day (BID) | ORAL | Status: DC

## 2015-01-04 MED ORDER — DEXTROSE 5 % IV SOLN
1.0000 g | Freq: Once | INTRAVENOUS | Status: AC
  Administered 2015-01-04: 1 g via INTRAVENOUS
  Filled 2015-01-04: qty 10

## 2015-01-04 NOTE — ED Notes (Signed)
Pt requesting nausea medications, MD aware

## 2015-01-04 NOTE — ED Notes (Signed)
Discussed plan of care with Dr. Mora Bellman, verbal order for 12.5mg  of phernergan.

## 2015-01-04 NOTE — ED Notes (Signed)
reinterated with the patient to continue wearing monitoring equipment, or she cannot receive more pain medications as O2 sats cannot be monitored continuously. She acknowledges, monitoring equipment reapplied.

## 2015-01-04 NOTE — ED Notes (Signed)
Dr. Mora Bellman at the bedside for update.

## 2015-01-04 NOTE — ED Notes (Signed)
Dr. Mora Bellman is at the bedside.

## 2015-01-04 NOTE — ED Notes (Signed)
Reported to Dr. Mora Bellman family at bedside would like update.

## 2015-01-04 NOTE — ED Notes (Signed)
Called dr. Mora Bellman as family asks "what are you gonna do about the nausea". Reviewed with md that patient takes phenergan at home, md offers reglan or zofran. Patient declines prescriptions and will only use phenergan.

## 2015-01-04 NOTE — Discharge Instructions (Signed)
Abdominal Pain, Adult Maria [name], your urine shows an infection, take antibiotics until completed.  See your primary doctor within 3 days for close follow up.  Continue your medications at home for your symptoms.  If there is any worsening, come back to the ED immediately.  Thank you. Many things can cause belly (abdominal) pain. Most times, the belly pain is not dangerous. Many cases of belly pain can be watched and treated at home. HOME CARE   Do not take medicines that help you go poop (laxatives) unless told to by your doctor.  Only take medicine as told by your doctor.  Eat or drink as told by your doctor. Your doctor will tell you if you should be on a special diet. GET HELP IF:  You do not know what is causing your belly pain.  You have belly pain while you are sick to your stomach (nauseous) or have runny poop (diarrhea).  You have pain while you pee or poop.  Your belly pain wakes you up at night.  You have belly pain that gets worse or better when you eat.  You have belly pain that gets worse when you eat fatty foods.  You have a fever. GET HELP RIGHT AWAY IF:   The pain does not go away within 2 hours.  You keep throwing up (vomiting).  The pain changes and is only in the right or left part of the belly.  You have bloody or tarry looking poop. MAKE SURE YOU:   Understand these instructions.  Will watch your condition.  Will get help right away if you are not doing well or get worse.   This information is not intended to replace advice given to you by your health care provider. Make sure you discuss any questions you have with your health care provider.   Document Released: 09/04/2007 Document Revised: 04/08/2014 Document Reviewed: 11/25/2012 Elsevier Interactive Patient Education 2016 [employer] Nausea and Vomiting Nausea means you feel sick to your stomach. Throwing up (vomiting) is a reflex where stomach contents come out of your mouth. HOME CARE   Take  medicine as told by your doctor.  Do not force yourself to eat. However, you do need to drink fluids.  If you feel like eating, eat a normal diet as told by your doctor.  Eat rice, wheat, potatoes, bread, lean meats, yogurt, fruits, and vegetables.  Avoid high-fat foods.  Drink enough fluids to keep your pee (urine) clear or pale yellow.  Ask your doctor how to replace body fluid losses (rehydrate). Signs of body fluid loss (dehydration) include:  Feeling very thirsty.  Dry lips and mouth.  Feeling dizzy.  Dark pee.  Peeing less than normal.  Feeling confused.  Fast breathing or heart rate. GET HELP RIGHT AWAY IF:   You have blood in your throw up.  You have black or bloody poop (stool).  You have a bad headache or stiff neck.  You feel confused.  You have bad belly (abdominal) pain.  You have chest pain or trouble breathing.  You do not pee at least once every 8 hours.  You have cold, clammy skin.  You keep throwing up after 24 to 48 hours.  You have a fever. MAKE SURE YOU:   Understand these instructions.  Will watch your condition.  Will get help right away if you are not doing well or get worse.   This information is not intended to replace advice given to you by your health  care provider. Make sure you discuss any questions you have with your health care provider.   Document Released: 09/04/2007 Document Revised: 06/10/2011 Document Reviewed: 08/17/2010 Elsevier Interactive Patient Education 2016 [employer] Urinary Tract Infection A urinary tract infection (UTI) can occur any place along the urinary tract. The tract includes the kidneys, ureters, bladder, and urethra. A type of germ called bacteria often causes a UTI. UTIs are often helped with antibiotic medicine.  HOME CARE   If given, take antibiotics as told by your doctor. Finish them even if you start to feel better.  Drink enough fluids to keep your pee (urine) clear or pale  yellow.  Avoid tea, drinks with caffeine, and bubbly (carbonated) drinks.  Pee often. Avoid holding your pee in for a long time.  Pee before and after having sex (intercourse).  Wipe from front to back after you poop (bowel movement) if you are a woman. Use each tissue only once. GET HELP RIGHT AWAY IF:   You have back pain.  You have lower belly (abdominal) pain.  You have chills.  You feel sick to your stomach (nauseous).  You throw up (vomit).  Your burning or discomfort with peeing does not go away.  You have a fever.  Your symptoms are not better in 3 days. MAKE SURE YOU:   Understand these instructions.  Will watch your condition.  Will get help right away if you are not doing well or get worse.   This information is not intended to replace advice given to you by your health care provider. Make sure you discuss any questions you have with your health care provider.   Document Released: 09/04/2007 Document Revised: 04/08/2014 Document Reviewed: 10/17/2011 Elsevier Interactive Patient Education [employer]

## 2015-01-04 NOTE — ED Provider Notes (Signed)
CSN: [id number]     Arrival date & time 01/03/15  2233 History  By signing my name below, I, Maria [name], attest that this documentation has been prepared under the direction and in the presence of Tomasita Crumble, MD . Electronically Signed: Freida [name], Scribe. 01/04/2015. 1:21 AM.   Chief Complaint  Patient presents with  . Abdominal Pain   The history is provided by the patient. No language interpreter was used.   HPI Comments:  Maria [name] is a 44 y.o. female who presents to the Emergency Department complaining of nausea and vomiting since last night (01/03/15). She reports associated "jittering", diarrhea, back pain and diffuse abdominal pain. Pt notes her symptoms began after she changed her fentanyl patch yesterday. She has a history of similar episode after removing her fentanyl patch, states went into withdrawal. No alleviating factors noted. Pt sees pain management every 2 months and is on the patch for chronic back pain secondary to multiple back surgeries.   Past Medical History  Diagnosis Date  . Thyroid disease     hypothyroidism  . Hyperlipidemia   . Hypertension   . Depression   . Insomnia   . Frequent UTI   . Chronic low back pain    Past Surgical History  Procedure Laterality Date  . Cesarean section    . Thyroidectomy Right 2001  . Cholecystectomy    . Breast surgery      lumpectomy  . Endometrial ablation    . L5-s1 fusion  2008, 2009, 2011   Family History  Problem Relation Age of Onset  . Hypertension Mother   . Cancer Mother     breast  . Hypothyroidism Mother   . Hypertension Father   . Hypothyroidism Father    Social History  Substance Use Topics  . Smoking status: Current Every Day Smoker -- 0.75 packs/day  . Smokeless tobacco: Never Used  . Alcohol Use: No   OB History    No data available     Review of Systems  A complete 10 system review of systems was obtained and all systems are negative except as noted in the HPI and PMH.    Allergies  Compazine and Sulfa antibiotics  Home Medications   Prior to Admission medications   Medication Sig Start Date End Date Taking? Authorizing Provider  amLODipine-benazepril (LOTREL) 10-20 MG per capsule Take 1 capsule by mouth daily. 12/14/14  Yes Mary-Margaret Daphine Deutscher, FNP  escitalopram (LEXAPRO) 10 MG tablet Take 1 tablet (10 mg total) by mouth daily. 12/14/14  Yes Mary-Margaret Daphine Deutscher, FNP  fentaNYL (DURAGESIC - DOSED MCG/HR) 75 MCG/HR 1 patch every 2 days 11/18/12  Yes Mary-Margaret Daphine Deutscher, FNP  oxyCODONE-acetaminophen (PERCOCET) 10-325 MG per tablet Take 1 tablet by mouth every 6 (six) hours as needed for pain.  08/24/13  Yes Historical Provider, MD  promethazine (PHENERGAN) 25 MG tablet Take 25 mg by mouth every 4 (four) hours as needed for nausea.  08/26/13  Yes Historical Provider, MD   BP 142/96 mmHg  Pulse 60  Temp(Src) 98.3 F (36.8 C) (Oral)  Resp 19  Ht 5\' 10"  (1.778 m)  Wt 134 lb (60.782 kg)  BMI 19.23 kg/m2  SpO2 99% Physical Exam  Constitutional: She is oriented to person, place, and time. She appears well-developed and well-nourished. She appears distressed.  Pt is writhinng around in bed, sitting on knees in stretcher and rolling over  HENT:  Head: Normocephalic and atraumatic.  Nose: Nose normal.  Mouth/Throat: Oropharynx is clear  and moist. No oropharyngeal exudate.  Eyes: Conjunctivae and EOM are normal. Pupils are equal, round, and reactive to light. No scleral icterus.  Neck: Normal range of motion. Neck supple. No JVD present. No tracheal deviation present. No thyromegaly present.  Cardiovascular: Normal rate, regular rhythm and normal heart sounds.  Exam reveals no gallop and no friction rub.   No murmur heard. Pulmonary/Chest: Effort normal and breath sounds normal. No respiratory distress. She has no wheezes. She exhibits no tenderness.  Fentanyl patch noted to left chest   Abdominal: Soft. Bowel sounds are normal. She exhibits no distension and  no mass. There is no tenderness. There is no rebound and no guarding.  Musculoskeletal: Normal range of motion. She exhibits no edema or tenderness.  Lymphadenopathy:    She has no cervical adenopathy.  Neurological: She is alert and oriented to person, place, and time. No cranial nerve deficit. She exhibits normal muscle tone.  Skin: Skin is warm and dry. No rash noted. No erythema. No pallor.  Nursing note and vitals reviewed.   ED Course  Procedures   DIAGNOSTIC STUDIES:  Oxygen Saturation is 100% on RA, normal by my interpretation.    COORDINATION OF CARE:  12:24 AM Discussed treatment plan with pt at bedside and pt agreed to plan.  Labs Review Labs Reviewed  CBC WITH DIFFERENTIAL/PLATELET - Abnormal; Notable for the following:    WBC 13.8 (*)    Neutro Abs 11.4 (*)    All other components within normal limits  COMPREHENSIVE METABOLIC PANEL - Abnormal; Notable for the following:    Potassium 3.0 (*)    CO2 19 (*)    Glucose, Bld 145 (*)    ALT 13 (*)    All other components within normal limits  URINALYSIS, ROUTINE W REFLEX MICROSCOPIC (NOT AT [facility]) - Abnormal; Notable for the following:    APPearance CLOUDY (*)    Ketones, ur 40 (*)    Protein, ur 30 (*)    Nitrite POSITIVE (*)    Leukocytes, UA SMALL (*)    All other components within normal limits  URINE MICROSCOPIC-ADD ON - Abnormal; Notable for the following:    Squamous Epithelial / LPF FEW (*)    Bacteria, UA MANY (*)    Crystals CA OXALATE CRYSTALS (*)    All other components within normal limits  LIPASE, BLOOD  POC URINE PREG, ED    Imaging Review Ct Abdomen Pelvis W Contrast  01/04/2015   CLINICAL DATA:  Acute onset of generalized abdominal pain, nausea and vomiting. Initial encounter.  EXAM: CT ABDOMEN AND PELVIS WITH CONTRAST  TECHNIQUE: Multidetector CT imaging of the abdomen and pelvis was performed using the standard protocol following bolus administration of intravenous contrast.  CONTRAST:   OMNIPAQUE IOHEXOL 300 MG/ML  SOLN  COMPARISON:  CT of the lumbar spine from 05/03/2008  FINDINGS: The visualized lung bases are clear.  The liver and spleen are unremarkable in appearance. The patient is status post cholecystectomy, with clips noted at the gallbladder fossa. The pancreas and adrenal glands are unremarkable.  The kidneys are unremarkable in appearance. There is no evidence of hydronephrosis. No renal or ureteral stones are seen. No perinephric stranding is appreciated.  No free fluid is identified. The small bowel is unremarkable in appearance. The stomach is within normal limits. No acute vascular abnormalities are seen. Minimal calcification is noted along the abdominal aorta and its branches.  The appendix is normal in caliber, without evidence of appendicitis. The  colon is largely filled with fluid. Apparent very mild wall thickening along the colon could reflect a mild infectious or inflammatory process.  The bladder is mildly distended and grossly unremarkable. The uterus is unremarkable in appearance. The ovaries are not well assessed due to surrounding bowel. No suspicious adnexal masses are seen. No inguinal lymphadenopathy is seen.  No acute osseous abnormalities are identified. The patient is status post lumbosacral spinal fusion at L5-S1. There is minimal grade 1 anterolisthesis of L4 on L5, reflecting underlying facet disease.  IMPRESSION: Colon largely filled with fluid. Apparent very mild wall thickening along the entirety of the colon could reflect a mild infectious or inflammatory process, given the patient's symptoms. Otherwise unremarkable contrast-enhanced CT of the abdomen and pelvis.   Electronically Signed   By: Roanna Raider M.D.   On: 01/04/2015 05:45   I have personally reviewed and evaluated these lab results as part of my medical decision-making.   EKG Interpretation None      MDM   Final diagnoses:  None   Patient received multiple rounds of phenergan and  dilaudid for her symptoms.  Continues to complain of withdrawal.  I discussed the case with Dr. Julian Reil who does not believe medical admission is warranted despite leukocytosis, abdominal pain, and UTI.  I kept the patient in the ED overnight and treated her with ceftriaxone.  She did appear better in the morning and is requesting to go home.  She states she still does not feel completely better.  Will DC with Rx for keflex and patient will contact PCP for further care.  CT abdomen is negative for acute process.  She appears well and in NAD.  VS remainw within her normal limits and she is safe for DC.     I, Wynn Kernes, personally performed the services described in this documentation. All medical record entries made by the scribe were at my direction and in my presence.  I have reviewed the chart and discharge instructions and agree that the record reflects my personal performance and is accurate and complete. Muskan Bolla.  01/04/2015. 5:02 PM.     Tomasita Crumble, MD 01/04/15 1704

## 2015-01-13 ENCOUNTER — Telehealth: Payer: Self-pay | Admitting: Nurse Practitioner

## 2015-01-13 NOTE — Telephone Encounter (Signed)
Appointment given for Monday with Maria [name].

## 2015-01-16 ENCOUNTER — Ambulatory Visit: Payer: Commercial Managed Care - HMO | Admitting: Nurse Practitioner

## 2015-01-17 ENCOUNTER — Encounter: Payer: Self-pay | Admitting: Nurse Practitioner

## 2015-06-06 ENCOUNTER — Other Ambulatory Visit: Payer: Self-pay | Admitting: Nurse Practitioner

## 2015-06-07 IMAGING — CR DG CHEST 2V
2 series · 2 of 2 positions shown · non-contrast
Comparison: Chest x-ray of 04/18/2009

CLINICAL DATA: Smoking history on a yearly physical exam

EXAM:
CHEST  2 VIEW

[view not recorded (1 of 2)]
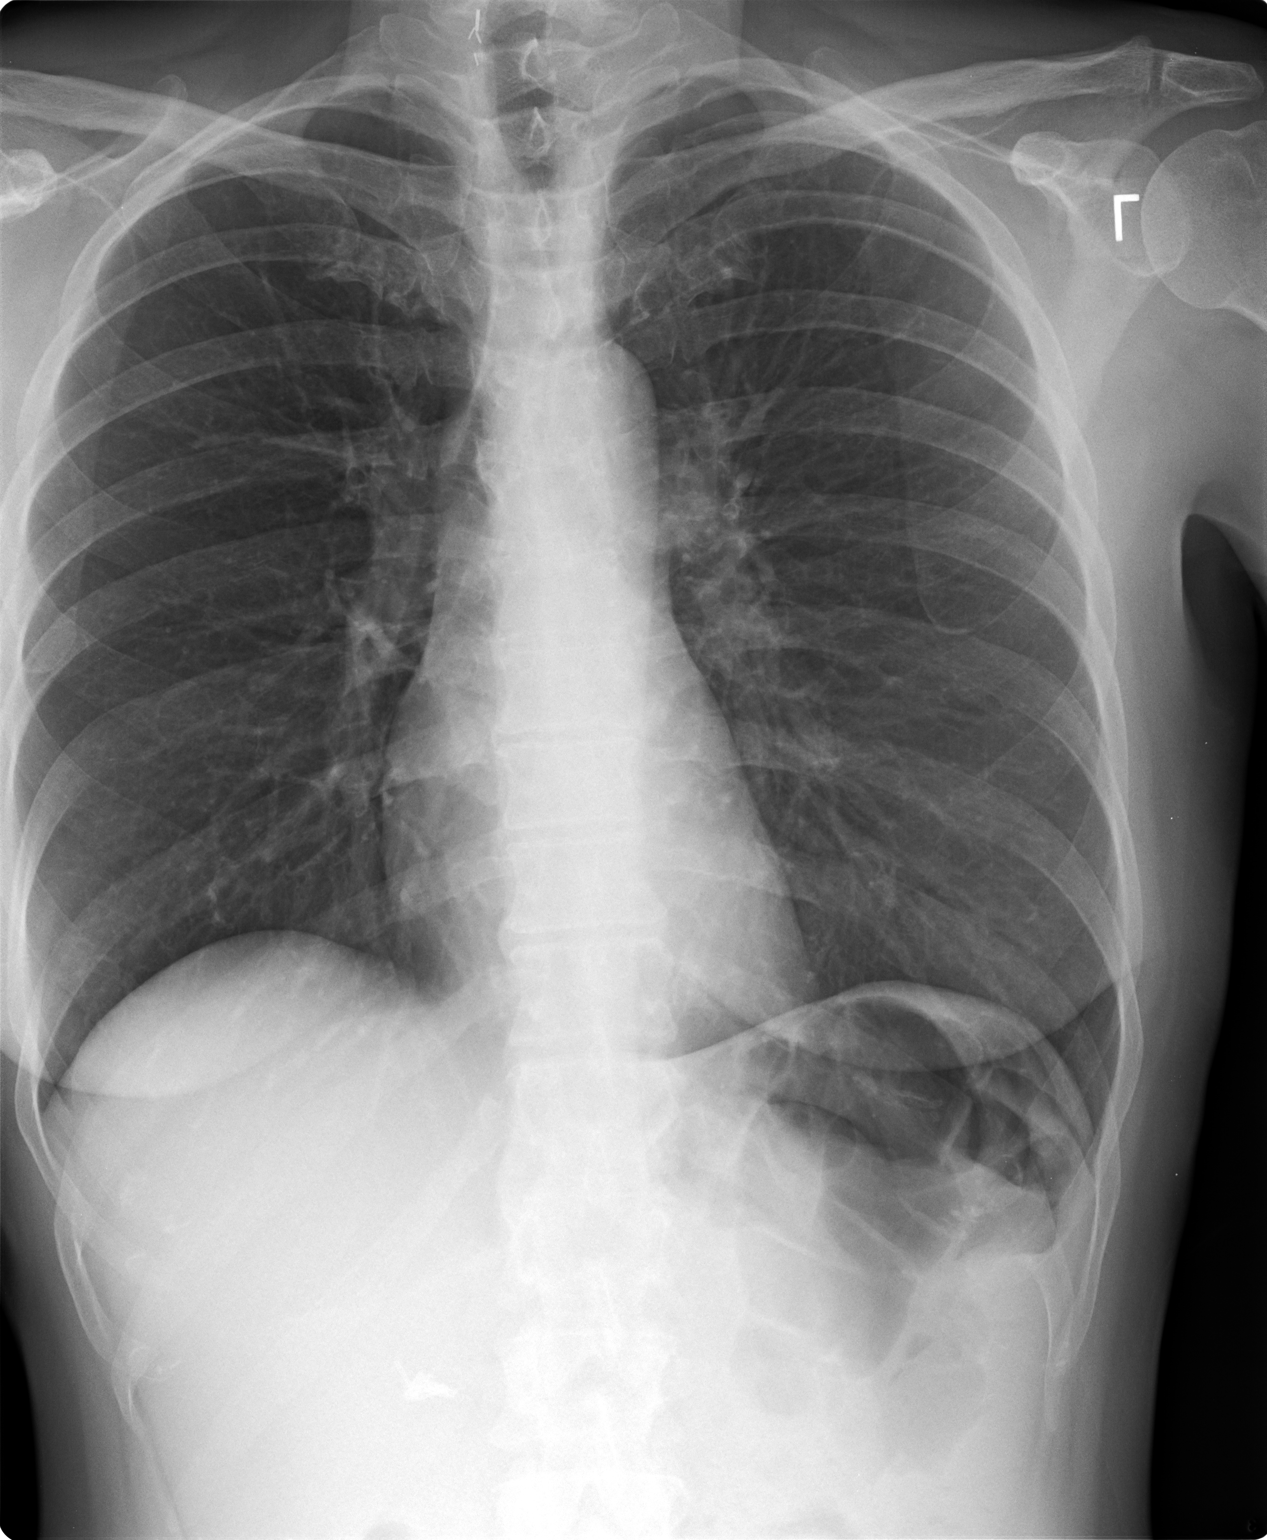

[view not recorded (2 of 2)]
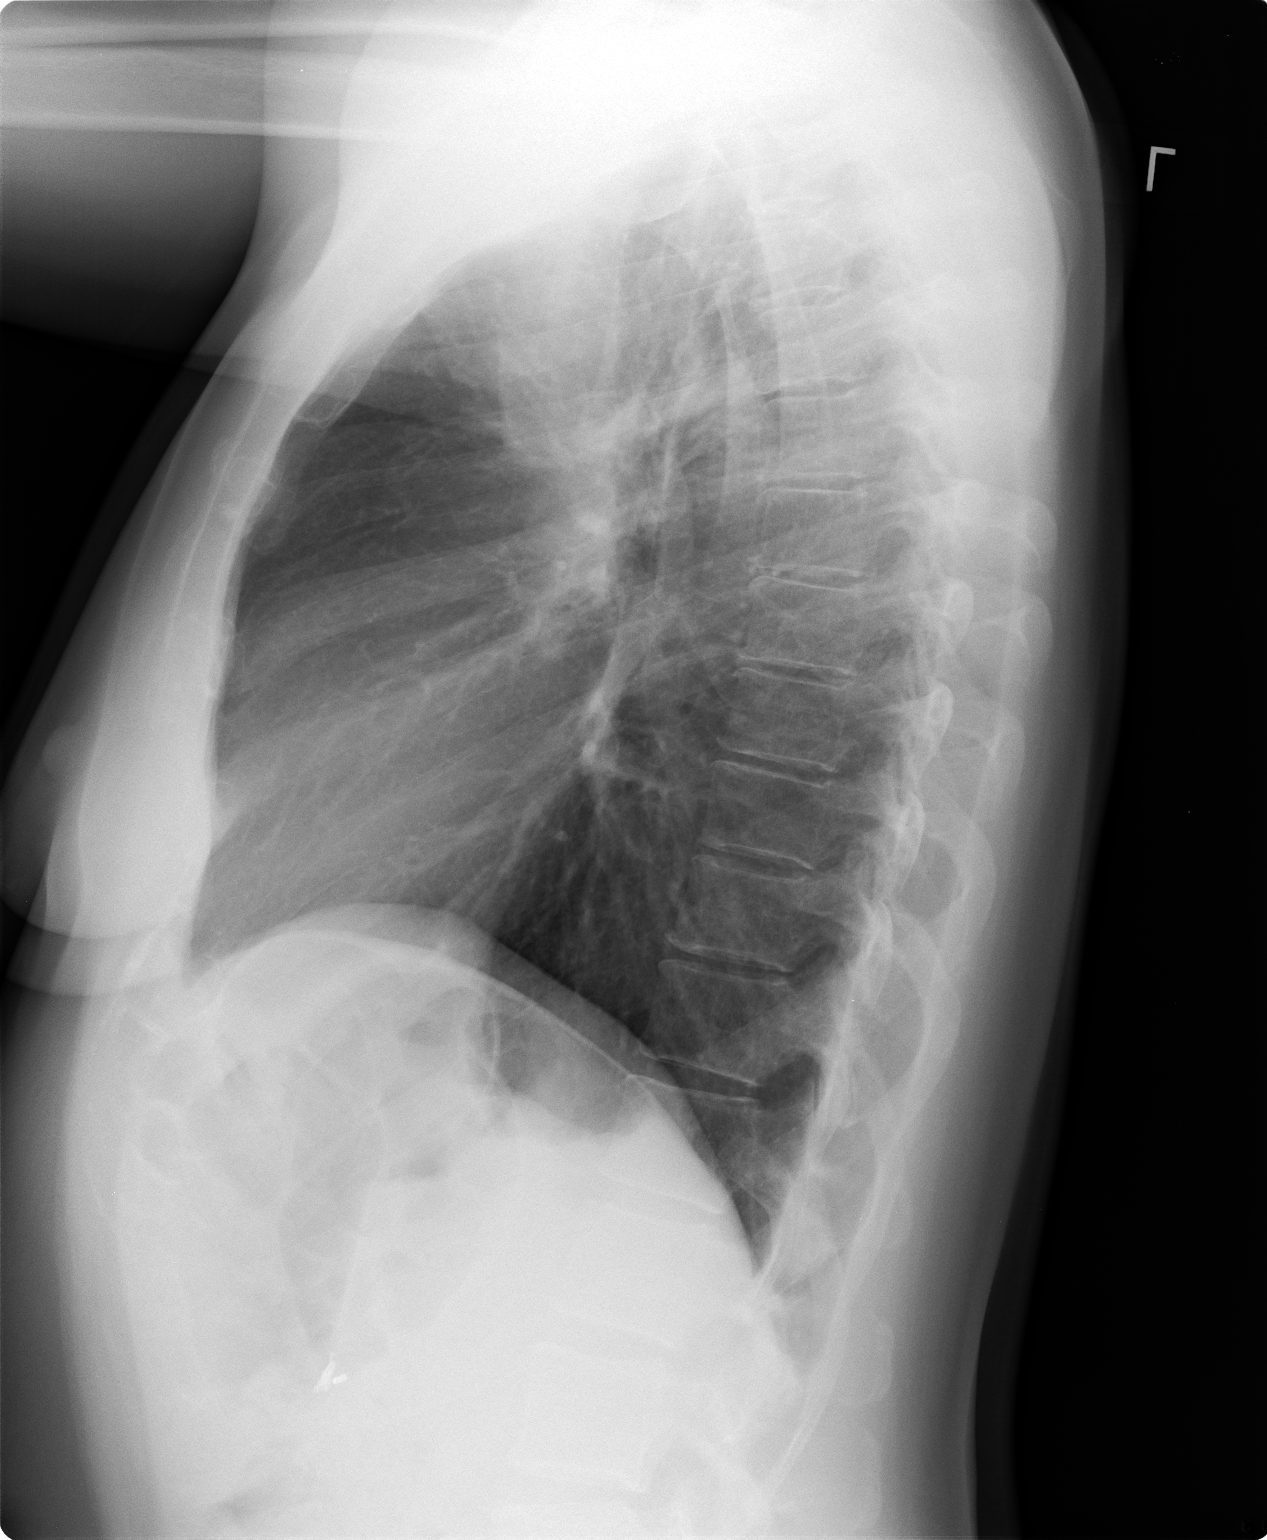

[2 of 2 positions shown; findings below may reference images not displayed]

FINDINGS: No active infiltrate or effusion is seen. Mediastinal and hilar
contours appear normal. The heart is within normal limits in size.
No bony abnormality is seen.
IMPRESSION: No active cardiopulmonary disease.

## 2015-06-22 ENCOUNTER — Encounter: Payer: Self-pay | Admitting: Nurse Practitioner

## 2015-06-22 ENCOUNTER — Ambulatory Visit (INDEPENDENT_AMBULATORY_CARE_PROVIDER_SITE_OTHER): Payer: Commercial Managed Care - HMO | Admitting: Nurse Practitioner

## 2015-06-22 VITALS — BP 126/79 | HR 62 | Temp 97.1°F | Ht 70.0 in | Wt 132.0 lb

## 2015-06-22 DIAGNOSIS — Z01419 Encounter for gynecological examination (general) (routine) without abnormal findings: Secondary | ICD-10-CM

## 2015-06-22 DIAGNOSIS — G8929 Other chronic pain: Secondary | ICD-10-CM

## 2015-06-22 DIAGNOSIS — F329 Major depressive disorder, single episode, unspecified: Secondary | ICD-10-CM

## 2015-06-22 DIAGNOSIS — F32A Depression, unspecified: Secondary | ICD-10-CM

## 2015-06-22 DIAGNOSIS — I10 Essential (primary) hypertension: Secondary | ICD-10-CM

## 2015-06-22 DIAGNOSIS — E039 Hypothyroidism, unspecified: Secondary | ICD-10-CM

## 2015-06-22 DIAGNOSIS — Z0000 Encounter for general adult medical examination without abnormal findings: Secondary | ICD-10-CM | POA: Diagnosis not present

## 2015-06-22 DIAGNOSIS — G4700 Insomnia, unspecified: Secondary | ICD-10-CM

## 2015-06-22 DIAGNOSIS — N6310 Unspecified lump in the right breast, unspecified quadrant: Secondary | ICD-10-CM

## 2015-06-22 DIAGNOSIS — E785 Hyperlipidemia, unspecified: Secondary | ICD-10-CM

## 2015-06-22 DIAGNOSIS — M549 Dorsalgia, unspecified: Secondary | ICD-10-CM

## 2015-06-22 MED ORDER — ESCITALOPRAM OXALATE 10 MG PO TABS: 10.0000 mg | ORAL_TABLET | Freq: Every day | ORAL | Status: DC

## 2015-06-22 MED ORDER — AMLODIPINE BESY-BENAZEPRIL HCL 10-20 MG PO CAPS: 1.0000 | ORAL_CAPSULE | Freq: Every day | ORAL | Status: DC

## 2015-06-22 NOTE — Progress Notes (Signed)
Subjective:    Patient ID: Gaynelle Cage, female    DOB: 1970/08/23, 45 y.o.   MRN: [id number]  HPI  Pt in today for CPE and pap. Lump on right breast. Having problems gaining any weight related to Fentanyl. Have been able to gain 10 pounds since stopping the fentanyl.   Current Outpatient Prescriptions on File Prior to Visit  Medication Sig Dispense Refill  . amLODipine-benazepril (LOTREL) 10-20 MG per capsule Take 1 capsule by mouth daily. 30 capsule 5  . cephALEXin (KEFLEX) 500 MG capsule Take 1 capsule (500 mg total) by mouth 2 (two) times daily. 6 capsule 0  . escitalopram (LEXAPRO) 10 MG tablet TAKE 1 TABLET BY MOUTH EVERY DAY 30 tablet 0  . fentaNYL (DURAGESIC - DOSED MCG/HR) 75 MCG/HR 1 patch every 2 days 15 patch 0  . oxyCODONE-acetaminophen (PERCOCET) 10-325 MG per tablet Take 1 tablet by mouth every 6 (six) hours as needed for pain.     . promethazine (PHENERGAN) 25 MG suppository Place 1 suppository (25 mg total) rectally every 6 (six) hours as needed for nausea or vomiting. 12 each 0   No current facility-administered medications on file prior to visit.    Review of Systems  Constitutional: Positive for unexpected weight change.  HENT: Negative.   Eyes: Negative.   Respiratory: Negative.   Cardiovascular: Negative.   Gastrointestinal: Negative.   Endocrine: Positive for cold intolerance.  Genitourinary: Negative.   Musculoskeletal: Negative.   Skin: Negative.   Allergic/Immunologic: Negative.   Neurological: Negative.   Hematological: Negative.   Psychiatric/Behavioral: Negative.         Objective:   Physical Exam  Constitutional: She is oriented to person, place, and time. She appears well-developed and well-nourished.  HENT:  Head: Normocephalic.  Right Ear: External ear normal.  Left Ear: External ear normal.  Nose: Nose normal.  Eyes: Conjunctivae and EOM are normal. Pupils are equal, round, and reactive to light.  Neck: Normal range of motion.    Thyroid nodule on left side. Right side thyroidectomy  Cardiovascular: Normal rate, regular rhythm, normal heart sounds and intact distal pulses.   Pulmonary/Chest: Effort normal and breath sounds normal.  Abdominal: Soft. Bowel sounds are normal.  Genitourinary: Vagina normal and uterus normal. Guaiac negative stool.  Cervix parous and pink no discharge  Musculoskeletal: Normal range of motion.  Neurological: She is alert and oriented to person, place, and time.  Skin: Skin is warm and dry.  Psychiatric: She has a normal mood and affect. Her behavior is normal. Judgment and thought content normal.  Vitals reviewed. Small BB sized lump at 12:00 on right breast. First noticed in December and has not grown since then.   BP 126/79 mmHg  Pulse 62  Temp(Src) 97.1 F (36.2 C) (Oral)  Ht  (1.778 m)  Wt 132 lb (59.875 kg)  BMI 18.94 kg/m2      Assessment & Plan:  1. Essential hypertension Do not add salt to diet - amLODipine-benazepril (LOTREL) 10-20 MG capsule; Take 1 capsule by mouth daily.  Dispense: 30 capsule; Refill: 5  2. Hypothyroidism, unspecified hypothyroidism type  3. Hyperlipidemia Low fat diet   4. Depression - escitalopram (LEXAPRO) 10 MG tablet; Take 1 tablet (10 mg total) by mouth daily.  Dispense: 30 tablet; Refill: 5  5. Chronic back pain  6. Insomnia Minimal caffeine in diet. None at least 2 hours before bed  7. Annual physical exam  8. Encounter for routine gynecological examination  Continue all meds Labs pending Health Maintenance reviewed Diet and exercise encouraged RTO 6 months   Midge AverVicki Smith FNP Student Mary-Margaret Daphine DeutscherMartin, FNP

## 2015-06-22 NOTE — Addendum Note (Signed)
Addended by: Bennie PieriniMARTIN, MARY-MARGARET on: 06/22/2015 03:02 PM   Modules accepted: Orders, SmartSet

## 2015-06-22 NOTE — Patient Instructions (Signed)
Health Maintenance, Female Adopting a healthy lifestyle and getting preventive care can go a long way to promote health and wellness. Talk with your health care provider about what schedule of regular examinations is right for you. This is a good chance for you to check in with your provider about disease prevention and staying healthy. In between checkups, there are plenty of things you can do on your own. Experts have done a lot of research about which lifestyle changes and preventive measures are most likely to keep you healthy. Ask your health care provider for more information. WEIGHT AND DIET  Eat a healthy diet  Be sure to include plenty of vegetables, fruits, low-fat dairy products, and lean protein.  Do not eat a lot of foods high in solid fats, added sugars, or salt.  Get regular exercise. This is one of the most important things you can do for your health.  Most adults should exercise for at least 150 minutes each week. The exercise should increase your heart rate and make you sweat (moderate-intensity exercise).  Most adults should also do strengthening exercises at least twice a week. This is in addition to the moderate-intensity exercise.  Maintain a healthy weight  Body mass index (BMI) is a measurement that can be used to identify possible weight problems. It estimates body fat based on height and weight. Your health care provider can help determine your BMI and help you achieve or maintain a healthy weight.  For females 20 years of age and older:   A BMI below 18.5 is considered underweight.  A BMI of 18.5 to 24.9 is normal.  A BMI of 25 to 29.9 is considered overweight.  A BMI of 30 and above is considered obese.  Watch levels of cholesterol and blood lipids  You should start having your blood tested for lipids and cholesterol at 47 years of age, then have this test every 5 years.  You may need to have your cholesterol levels checked more often if:  Your lipid  or cholesterol levels are high.  You are older than 47 years of age.  You are at high risk for heart disease.  CANCER SCREENING   Lung Cancer  Lung cancer screening is recommended for adults 55-80 years old who are at high risk for lung cancer because of a history of smoking.  A yearly low-dose CT scan of the lungs is recommended for people who:  Currently smoke.  Have quit within the past 15 years.  Have at least a 30-pack-year history of smoking. A pack year is smoking an average of one pack of cigarettes a day for 1 year.  Yearly screening should continue until it has been 15 years since you quit.  Yearly screening should stop if you develop a health problem that would prevent you from having lung cancer treatment.  Breast Cancer  Practice breast self-awareness. This means understanding how your breasts normally appear and feel.  It also means doing regular breast self-exams. Let your health care provider know about any changes, no matter how small.  If you are in your 20s or 30s, you should have a clinical breast exam (CBE) by a health care provider every 1-3 years as part of a regular health exam.  If you are 40 or older, have a CBE every year. Also consider having a breast X-ray (mammogram) every year.  If you have a family history of breast cancer, talk to your health care provider about genetic screening.  If you   are at high risk for breast cancer, talk to your health care provider about having an MRI and a mammogram every year.  Breast cancer gene (BRCA) assessment is recommended for women who have family members with BRCA-related cancers. BRCA-related cancers include:  Breast.  Ovarian.  Tubal.  Peritoneal cancers.  Results of the assessment will determine the need for genetic counseling and BRCA1 and BRCA2 testing. Cervical Cancer Your health care provider may recommend that you be screened regularly for cancer of the pelvic organs (ovaries, uterus, and  vagina). This screening involves a pelvic examination, including checking for microscopic changes to the surface of your cervix (Pap test). You may be encouraged to have this screening done every 3 years, beginning at age 21.  For women ages 30-65, health care providers may recommend pelvic exams and Pap testing every 3 years, or they may recommend the Pap and pelvic exam, combined with testing for human papilloma virus (HPV), every 5 years. Some types of HPV increase your risk of cervical cancer. Testing for HPV may also be done on women of any age with unclear Pap test results.  Other health care providers may not recommend any screening for nonpregnant women who are considered low risk for pelvic cancer and who do not have symptoms. Ask your health care provider if a screening pelvic exam is right for you.  If you have had past treatment for cervical cancer or a condition that could lead to cancer, you need Pap tests and screening for cancer for at least 20 years after your treatment. If Pap tests have been discontinued, your risk factors (such as having a new sexual partner) need to be reassessed to determine if screening should resume. Some women have medical problems that increase the chance of getting cervical cancer. In these cases, your health care provider may recommend more frequent screening and Pap tests. Colorectal Cancer  This type of cancer can be detected and often prevented.  Routine colorectal cancer screening usually begins at 47 years of age and continues through 47 years of age.  Your health care provider may recommend screening at an earlier age if you have risk factors for colon cancer.  Your health care provider may also recommend using home test kits to check for hidden blood in the stool.  A small camera at the end of a tube can be used to examine your colon directly (sigmoidoscopy or colonoscopy). This is done to check for the earliest forms of colorectal  cancer.  Routine screening usually begins at age 50.  Direct examination of the colon should be repeated every 5-10 years through 47 years of age. However, you may need to be screened more often if early forms of precancerous polyps or small growths are found. Skin Cancer  Check your skin from head to toe regularly.  Tell your health care provider about any new moles or changes in moles, especially if there is a change in a mole's shape or color.  Also tell your health care provider if you have a mole that is larger than the size of a pencil eraser.  Always use sunscreen. Apply sunscreen liberally and repeatedly throughout the day.  Protect yourself by wearing long sleeves, pants, a wide-brimmed hat, and sunglasses whenever you are outside. HEART DISEASE, DIABETES, AND HIGH BLOOD PRESSURE   High blood pressure causes heart disease and increases the risk of stroke. High blood pressure is more likely to develop in:  People who have blood pressure in the high end   of the normal range (130-139/85-89 mm Hg).  People who are overweight or obese.  People who are African American.  If you are 38-23 years of age, have your blood pressure checked every 3-5 years. If you are 61 years of age or older, have your blood pressure checked every year. You should have your blood pressure measured twice--once when you are at a hospital or clinic, and once when you are not at a hospital or clinic. Record the average of the two measurements. To check your blood pressure when you are not at a hospital or clinic, you can use:  An automated blood pressure machine at a pharmacy.  A home blood pressure monitor.  If you are between 45 years and 39 years old, ask your health care provider if you should take aspirin to prevent strokes.  Have regular diabetes screenings. This involves taking a blood sample to check your fasting blood sugar level.  If you are at a normal weight and have a low risk for diabetes,  have this test once every three years after 47 years of age.  If you are overweight and have a high risk for diabetes, consider being tested at a younger age or more often. PREVENTING INFECTION  Hepatitis B  If you have a higher risk for hepatitis B, you should be screened for this virus. You are considered at high risk for hepatitis B if:  You were born in a country where hepatitis B is common. Ask your health care provider which countries are considered high risk.  Your parents were born in a high-risk country, and you have not been immunized against hepatitis B (hepatitis B vaccine).  You have HIV or AIDS.  You use needles to inject street drugs.  You live with someone who has hepatitis B.  You have had sex with someone who has hepatitis B.  You get hemodialysis treatment.  You take certain medicines for conditions, including cancer, organ transplantation, and autoimmune conditions. Hepatitis C  Blood testing is recommended for:  Everyone born from [birth year] through 1967.  Anyone with known risk factors for hepatitis C. Sexually transmitted infections (STIs)  You should be screened for sexually transmitted infections (STIs) including gonorrhea and chlamydia if:  You are sexually active and are younger than 47 years of age.  You are older than 47 years of age and your health care provider tells you that you are at risk for this type of infection.  Your sexual activity has changed since you were last screened and you are at an increased risk for chlamydia or gonorrhea. Ask your health care provider if you are at risk.  If you do not have HIV, but are at risk, it may be recommended that you take a prescription medicine daily to prevent HIV infection. This is called pre-exposure prophylaxis (PrEP). You are considered at risk if:  You are sexually active and do not regularly use condoms or know the HIV status of your partner(s).  You take drugs by injection.  You are sexually  active with a partner who has HIV. Talk with your health care provider about whether you are at high risk of being infected with HIV. If you choose to begin PrEP, you should first be tested for HIV. You should then be tested every 3 months for as long as you are taking PrEP.  PREGNANCY   If you are premenopausal and you may become pregnant, ask your health care provider about preconception counseling.  If you may  become pregnant, take 400 to 800 micrograms (mcg) of folic acid every day.  If you want to prevent pregnancy, talk to your health care provider about birth control (contraception). OSTEOPOROSIS AND MENOPAUSE   Osteoporosis is a disease in which the bones lose minerals and strength with aging. This can result in serious bone fractures. Your risk for osteoporosis can be identified using a bone density scan.  If you are 61 years of age or older, or if you are at risk for osteoporosis and fractures, ask your health care provider if you should be screened.  Ask your health care provider whether you should take a calcium or vitamin D supplement to lower your risk for osteoporosis.  Menopause may have certain physical symptoms and risks.  Hormone replacement therapy may reduce some of these symptoms and risks. Talk to your health care provider about whether hormone replacement therapy is right for you.  HOME CARE INSTRUCTIONS   Schedule regular health, dental, and eye exams.  Stay current with your immunizations.   Do not use any tobacco products including cigarettes, chewing tobacco, or electronic cigarettes.  If you are pregnant, do not drink alcohol.  If you are breastfeeding, limit how much and how often you drink alcohol.  Limit alcohol intake to no more than 1 drink per day for nonpregnant women. One drink equals 12 ounces of beer, 5 ounces of wine, or 1 ounces of hard liquor.  Do not use street drugs.  Do not share needles.  Ask your health care provider for help if  you need support or information about quitting drugs.  Tell your health care provider if you often feel depressed.  Tell your health care provider if you have ever been abused or do not feel safe at home.   This information is not intended to replace advice given to you by your health care provider. Make sure you discuss any questions you have with your health care provider.   Document Released: 10/01/2010 Document Revised: 04/08/2014 Document Reviewed: 02/17/2013 Elsevier Interactive Patient Education [employer].

## 2015-06-23 ENCOUNTER — Encounter: Payer: Self-pay | Admitting: Nurse Practitioner

## 2015-06-23 LAB — ANEMIA PROFILE B
BASOS ABS: 0.1 10*3/uL (ref 0.0–0.2)
Basos: 1 %
EOS (ABSOLUTE): 0 10*3/uL (ref 0.0–0.4)
Eos: 0 %
FOLATE: 5.8 ng/mL (ref >3.0)
Ferritin: 113 ng/mL (ref 15–150)
Hematocrit: 35.8 % (ref 34.0–46.6)
Hemoglobin: 11.8 g/dL (ref 11.1–15.9)
Immature Grans (Abs): 0 10*3/uL (ref 0.0–0.1)
Immature Granulocytes: 0 %
Iron Saturation: 31 % (ref 15–55)
Iron: 74 ug/dL (ref 27–159)
LYMPHS ABS: 1.6 10*3/uL (ref 0.7–3.1)
Lymphs: 38 %
MCH: 30.6 pg (ref 26.6–33.0)
MCHC: 33 g/dL (ref 31.5–35.7)
MCV: 93 fL (ref 79–97)
Monocytes Absolute: 0.3 10*3/uL (ref 0.1–0.9)
Monocytes: 6 %
NEUTROS ABS: 2.3 10*3/uL (ref 1.4–7.0)
NEUTROS PCT: 55 %
PLATELETS: 277 10*3/uL (ref 150–379)
RBC: 3.86 x10E6/uL (ref 3.77–5.28)
RDW: 14.1 % (ref 12.3–15.4)
Retic Ct Pct: 0.7 % (ref 0.6–2.6)
Total Iron Binding Capacity: 241 ug/dL — ABNORMAL LOW (ref 250–450)
UIBC: 167 ug/dL (ref 131–425)
VITAMIN B 12: 466 pg/mL (ref 211–946)
WBC: 4.2 10*3/uL (ref 3.4–10.8)

## 2015-06-23 LAB — THYROID PANEL WITH TSH
FREE THYROXINE INDEX: 1.5 (ref 1.2–4.9)
T3 UPTAKE RATIO: 26 % (ref 24–39)
T4 TOTAL: 5.8 ug/dL (ref 4.5–12.0)
TSH: 1.81 u[IU]/mL (ref 0.450–4.500)

## 2015-06-23 LAB — LIPID PANEL
CHOLESTEROL TOTAL: 151 mg/dL (ref 100–199)
Chol/HDL Ratio: 2.6 ratio units (ref 0.0–4.4)
HDL: 58 mg/dL (ref >39)
LDL CALC: 75 mg/dL (ref 0–99)
Triglycerides: 91 mg/dL (ref 0–149)
VLDL Cholesterol Cal: 18 mg/dL (ref 5–40)

## 2015-06-26 LAB — PAP IG W/ RFLX HPV ASCU: PAP Smear Comment: 0

## 2015-06-28 ENCOUNTER — Other Ambulatory Visit: Payer: Self-pay | Admitting: Nurse Practitioner

## 2015-06-28 DIAGNOSIS — R229 Localized swelling, mass and lump, unspecified: Principal | ICD-10-CM

## 2015-06-28 DIAGNOSIS — IMO0002 Reserved for concepts with insufficient information to code with codable children: Secondary | ICD-10-CM

## 2015-06-30 ENCOUNTER — Telehealth: Payer: Self-pay | Admitting: Nurse Practitioner

## 2015-06-30 ENCOUNTER — Other Ambulatory Visit: Payer: Self-pay | Admitting: Nurse Practitioner

## 2015-06-30 MED ORDER — OSELTAMIVIR PHOSPHATE 75 MG PO CAPS: 75.0000 mg | ORAL_CAPSULE | Freq: Two times a day (BID) | ORAL | Status: DC

## 2015-06-30 NOTE — Telephone Encounter (Signed)
Patient aware rx sent to pharmacy.  

## 2015-07-04 ENCOUNTER — Encounter (HOSPITAL_COMMUNITY): Payer: Commercial Managed Care - HMO

## 2015-07-11 ENCOUNTER — Encounter (HOSPITAL_COMMUNITY): Payer: Commercial Managed Care - HMO

## 2015-07-11 ENCOUNTER — Ambulatory Visit (HOSPITAL_COMMUNITY)
Admission: RE | Admit: 2015-07-11 | Discharge: 2015-07-11 | Disposition: A | Discharge status: Home / Self Care | Payer: Commercial Managed Care - HMO | Source: Ambulatory Visit | Attending: Nurse Practitioner | Admitting: Nurse Practitioner

## 2015-07-11 DIAGNOSIS — R229 Localized swelling, mass and lump, unspecified: Secondary | ICD-10-CM | POA: Diagnosis present

## 2015-07-11 DIAGNOSIS — N63 Unspecified lump in breast: Secondary | ICD-10-CM | POA: Insufficient documentation

## 2015-07-11 DIAGNOSIS — IMO0002 Reserved for concepts with insufficient information to code with codable children: Secondary | ICD-10-CM

## 2015-07-11 DIAGNOSIS — N6310 Unspecified lump in the right breast, unspecified quadrant: Secondary | ICD-10-CM

## 2015-08-29 ENCOUNTER — Encounter: Payer: Commercial Managed Care - HMO | Admitting: *Deleted

## 2016-01-19 ENCOUNTER — Other Ambulatory Visit: Payer: Self-pay | Admitting: Physical Medicine and Rehabilitation

## 2016-01-19 DIAGNOSIS — M5416 Radiculopathy, lumbar region: Secondary | ICD-10-CM

## 2016-01-30 ENCOUNTER — Ambulatory Visit
Admission: RE | Admit: 2016-01-30 | Discharge: 2016-01-30 | Disposition: A | Discharge status: Home / Self Care | Payer: Commercial Managed Care - HMO | Source: Ambulatory Visit | Attending: Physical Medicine and Rehabilitation | Admitting: Physical Medicine and Rehabilitation

## 2016-01-30 DIAGNOSIS — M5416 Radiculopathy, lumbar region: Secondary | ICD-10-CM

## 2016-01-30 MED ORDER — GADOBENATE DIMEGLUMINE 529 MG/ML IV SOLN: 13.0000 mL | Freq: Once | INTRAVENOUS | Status: DC | PRN

## 2016-01-30 MED ORDER — GADOBENATE DIMEGLUMINE 529 MG/ML IV SOLN
15.0000 mL | Freq: Once | INTRAVENOUS | Status: AC | PRN
  Administered 2016-01-30: 13 mL via INTRAVENOUS

## 2016-01-31 ENCOUNTER — Telehealth: Payer: Self-pay | Admitting: Nurse Practitioner

## 2016-01-31 NOTE — Telephone Encounter (Signed)
[facility]

## 2016-02-06 ENCOUNTER — Other Ambulatory Visit: Payer: Self-pay | Admitting: Nurse Practitioner

## 2016-02-06 DIAGNOSIS — I10 Essential (primary) hypertension: Secondary | ICD-10-CM

## 2016-02-16 ENCOUNTER — Other Ambulatory Visit: Payer: Self-pay | Admitting: *Deleted

## 2016-02-16 DIAGNOSIS — I10 Essential (primary) hypertension: Secondary | ICD-10-CM

## 2016-02-16 MED ORDER — AMLODIPINE BESY-BENAZEPRIL HCL 10-20 MG PO CAPS: ORAL_CAPSULE | ORAL | 0 refills | Status: DC

## 2016-04-01 HISTORY — PX: BACK SURGERY: SHX140

## 2016-04-02 DIAGNOSIS — M4317 Spondylolisthesis, lumbosacral region: Secondary | ICD-10-CM | POA: Diagnosis not present

## 2016-04-02 DIAGNOSIS — M48061 Spinal stenosis, lumbar region without neurogenic claudication: Secondary | ICD-10-CM | POA: Diagnosis not present

## 2016-04-02 DIAGNOSIS — M47816 Spondylosis without myelopathy or radiculopathy, lumbar region: Secondary | ICD-10-CM | POA: Diagnosis not present

## 2016-04-24 DIAGNOSIS — M4316 Spondylolisthesis, lumbar region: Secondary | ICD-10-CM | POA: Diagnosis not present

## 2016-04-24 DIAGNOSIS — M4726 Other spondylosis with radiculopathy, lumbar region: Secondary | ICD-10-CM | POA: Diagnosis not present

## 2016-04-24 DIAGNOSIS — M961 Postlaminectomy syndrome, not elsewhere classified: Secondary | ICD-10-CM | POA: Diagnosis not present

## 2016-04-24 DIAGNOSIS — G894 Chronic pain syndrome: Secondary | ICD-10-CM | POA: Diagnosis not present

## 2016-04-24 DIAGNOSIS — M5416 Radiculopathy, lumbar region: Secondary | ICD-10-CM | POA: Diagnosis not present

## 2016-04-30 ENCOUNTER — Ambulatory Visit
Admission: RE | Admit: 2016-04-30 | Discharge: 2016-04-30 | Disposition: A | Discharge status: Home / Self Care | Payer: Commercial Managed Care - HMO | Source: Ambulatory Visit | Attending: Neurosurgery | Admitting: Neurosurgery

## 2016-04-30 ENCOUNTER — Other Ambulatory Visit: Payer: Self-pay

## 2016-04-30 ENCOUNTER — Other Ambulatory Visit: Payer: Self-pay | Admitting: Neurosurgery

## 2016-04-30 DIAGNOSIS — M4306 Spondylolysis, lumbar region: Secondary | ICD-10-CM

## 2016-04-30 DIAGNOSIS — M5136 Other intervertebral disc degeneration, lumbar region: Secondary | ICD-10-CM | POA: Diagnosis not present

## 2016-05-01 DIAGNOSIS — M4317 Spondylolisthesis, lumbosacral region: Secondary | ICD-10-CM | POA: Diagnosis not present

## 2016-05-01 DIAGNOSIS — M47816 Spondylosis without myelopathy or radiculopathy, lumbar region: Secondary | ICD-10-CM | POA: Diagnosis not present

## 2016-05-14 DIAGNOSIS — Z01818 Encounter for other preprocedural examination: Secondary | ICD-10-CM | POA: Diagnosis not present

## 2016-05-14 DIAGNOSIS — I10 Essential (primary) hypertension: Secondary | ICD-10-CM | POA: Diagnosis not present

## 2016-05-14 DIAGNOSIS — M47816 Spondylosis without myelopathy or radiculopathy, lumbar region: Secondary | ICD-10-CM | POA: Diagnosis not present

## 2016-05-16 DIAGNOSIS — M5136 Other intervertebral disc degeneration, lumbar region: Secondary | ICD-10-CM | POA: Diagnosis not present

## 2016-05-16 DIAGNOSIS — M47816 Spondylosis without myelopathy or radiculopathy, lumbar region: Secondary | ICD-10-CM | POA: Diagnosis not present

## 2016-05-16 DIAGNOSIS — Z981 Arthrodesis status: Secondary | ICD-10-CM | POA: Diagnosis not present

## 2016-05-16 DIAGNOSIS — I10 Essential (primary) hypertension: Secondary | ICD-10-CM | POA: Diagnosis not present

## 2016-05-16 DIAGNOSIS — M4316 Spondylolisthesis, lumbar region: Secondary | ICD-10-CM | POA: Diagnosis not present

## 2016-05-16 DIAGNOSIS — M47896 Other spondylosis, lumbar region: Secondary | ICD-10-CM | POA: Diagnosis not present

## 2016-05-16 DIAGNOSIS — M4726 Other spondylosis with radiculopathy, lumbar region: Secondary | ICD-10-CM | POA: Diagnosis not present

## 2016-05-16 DIAGNOSIS — M4326 Fusion of spine, lumbar region: Secondary | ICD-10-CM | POA: Diagnosis not present

## 2016-05-22 DIAGNOSIS — M961 Postlaminectomy syndrome, not elsewhere classified: Secondary | ICD-10-CM | POA: Diagnosis not present

## 2016-05-22 DIAGNOSIS — G894 Chronic pain syndrome: Secondary | ICD-10-CM | POA: Diagnosis not present

## 2016-05-22 DIAGNOSIS — Z79891 Long term (current) use of opiate analgesic: Secondary | ICD-10-CM | POA: Diagnosis not present

## 2016-05-23 DIAGNOSIS — M47816 Spondylosis without myelopathy or radiculopathy, lumbar region: Secondary | ICD-10-CM | POA: Diagnosis not present

## 2016-05-23 DIAGNOSIS — Z981 Arthrodesis status: Secondary | ICD-10-CM | POA: Diagnosis not present

## 2016-07-02 ENCOUNTER — Other Ambulatory Visit: Payer: Commercial Managed Care - HMO | Admitting: Nurse Practitioner

## 2016-07-16 ENCOUNTER — Telehealth: Payer: Self-pay | Admitting: Nurse Practitioner

## 2016-07-16 DIAGNOSIS — R112 Nausea with vomiting, unspecified: Secondary | ICD-10-CM | POA: Diagnosis not present

## 2016-07-16 NOTE — Telephone Encounter (Signed)
Pt notified it has been more than 1 yr since last OV NTBS Pt declined appt

## 2016-09-12 ENCOUNTER — Other Ambulatory Visit: Payer: Self-pay | Admitting: Nurse Practitioner

## 2016-09-12 DIAGNOSIS — I10 Essential (primary) hypertension: Secondary | ICD-10-CM

## 2016-10-29 ENCOUNTER — Ambulatory Visit (INDEPENDENT_AMBULATORY_CARE_PROVIDER_SITE_OTHER): Payer: 59 | Admitting: Nurse Practitioner

## 2016-10-29 ENCOUNTER — Encounter: Payer: Self-pay | Admitting: Nurse Practitioner

## 2016-10-29 VITALS — BP 112/75 | HR 63 | Temp 97.0°F

## 2016-10-29 DIAGNOSIS — F3342 Major depressive disorder, recurrent, in full remission: Secondary | ICD-10-CM | POA: Diagnosis not present

## 2016-10-29 DIAGNOSIS — E041 Nontoxic single thyroid nodule: Secondary | ICD-10-CM | POA: Diagnosis not present

## 2016-10-29 DIAGNOSIS — F339 Major depressive disorder, recurrent, unspecified: Secondary | ICD-10-CM | POA: Diagnosis not present

## 2016-10-29 DIAGNOSIS — E039 Hypothyroidism, unspecified: Secondary | ICD-10-CM

## 2016-10-29 DIAGNOSIS — M545 Low back pain: Secondary | ICD-10-CM

## 2016-10-29 DIAGNOSIS — G8929 Other chronic pain: Secondary | ICD-10-CM | POA: Diagnosis not present

## 2016-10-29 DIAGNOSIS — E785 Hyperlipidemia, unspecified: Secondary | ICD-10-CM | POA: Diagnosis not present

## 2016-10-29 DIAGNOSIS — G4700 Insomnia, unspecified: Secondary | ICD-10-CM

## 2016-10-29 DIAGNOSIS — I10 Essential (primary) hypertension: Secondary | ICD-10-CM

## 2016-10-29 MED ORDER — ESCITALOPRAM OXALATE 10 MG PO TABS: 10.0000 mg | ORAL_TABLET | Freq: Every day | ORAL | 5 refills | Status: DC

## 2016-10-29 MED ORDER — AMLODIPINE BESY-BENAZEPRIL HCL 10-20 MG PO CAPS: ORAL_CAPSULE | ORAL | 0 refills | Status: DC

## 2016-10-29 NOTE — Patient Instructions (Signed)
 High-Protein and High-Calorie Diet Eating high-protein and high-calorie foods can help you to gain weight, heal after an injury, and recover after an illness or surgery. What is my plan? The specific amount of daily protein and calories you need depends on:  Your body weight.  The reason this diet is recommended for you.  Generally, a high-protein, high-calorie diet involves:  Eating 250-500 extra calories each day.  Making sure that 10-35% of your daily calories come from protein.  Talk to your health care provider about how much protein and how many calories you need each day. Follow the diet as directed by your health care provider. What do I need to know about this diet?  Ask your health care provider if you should take a nutritional supplement.  Try to eat six small meals each day instead of three large meals.  Eat a balanced diet, including one food that is high in protein at each meal.  Keep nutritious snacks handy, such as nuts, trail mixes, dried fruit, and yogurt.  If you have kidney disease or diabetes, eating too much protein may put extra stress on your kidneys. Talk to your health care provider if you have either of those conditions. What are some high-protein foods? Grains Quinoa. Bulgur wheat. Vegetables Soybeans. Peas. Meats and Other Protein Sources Beef, pork, and poultry. Fish and seafood. Eggs. Tofu. Textured vegetable protein (TVP). Peanut butter. Nuts and seeds. Dried beans. Protein powders. Dairy Whole milk. Whole-milk yogurt. Powdered milk. Cheese. Cottage Cheese. Eggnog. Beverages High-protein supplement drinks. Soy milk. Other Protein bars. The items listed above may not be a complete list of recommended foods or beverages. Contact your dietitian for more options. What are some high-calorie foods? Grains Pasta. Quick breads. Muffins. Pancakes. Ready-to-eat cereal. Vegetables Vegetables cooked in oil or butter. Fried potatoes. Fruits Dried  fruit. Fruit leather. Canned fruit in syrup. Fruit juice. Avocados. Meats and Other Protein Sources Peanut butter. Nuts and seeds. Dairy Heavy cream. Whipped cream. Cream cheese. Sour cream. Ice cream. Custard. Pudding. Beverages Meal-replacement beverages. Nutrition shakes. Fruit juice. Sugar-sweetened soft drinks. Condiments Salad dressing. Mayonnaise. Alfredo sauce. Fruit preserves or jelly. Honey. Syrup. Sweets/Desserts Cake. Cookies. Pie. Pastries. Candy bars. Chocolate. Fats and Oils Butter or margarine. Oil. Gravy. Other Meal-replacement bars. The items listed above may not be a complete list of recommended foods or beverages. Contact your dietitian for more options. What are some tips for including high-protein and high-calorie foods in my diet?  Add whole milk, half-and-half, or heavy cream to cereal, pudding, soup, or hot cocoa.  Add whole milk to instant breakfast drinks.  Add peanut butter to oatmeal or smoothies.  Add powdered milk to baked goods, smoothies, or milkshakes.  Add powdered milk, cream, or butter to mashed potatoes.  Add cheese to cooked vegetables.  Make whole-milk yogurt parfaits. Top them with granola, fruit, or nuts.  Add cottage cheese to your fruit.  Add avocados, cheese, or both to sandwiches or salads.  Add meat, poultry, or seafood to rice, pasta, casseroles, salads, and soups.  Use mayonnaise when making egg salad, chicken salad, or tuna salad.  Use peanut butter as a topping for pretzels, celery, or crackers.  Add beans to casseroles, dips, and spreads.  Add pureed beans to sauces and soups.  Replace calorie-free drinks with calorie-containing drinks, such as milk and fruit juice. This information is not intended to replace advice given to you by your health care provider. Make sure you discuss any questions you have with your   health care provider. Document Released: 03/18/2005 Document Revised: 08/24/2015 Document Reviewed:  08/31/2013 Elsevier Interactive Patient Education  2018 Elsevier Inc.  

## 2016-10-29 NOTE — Addendum Note (Signed)
Addended by: Bennie PieriniMARTIN, MARY-MARGARET on: 10/29/2016 12:54 PM   Modules accepted: Orders

## 2016-10-29 NOTE — Progress Notes (Signed)
Subjective:    Patient ID: Maria [name], female    DOB: 03-05-1971, 46 y.o.   MRN: [id number]  HPI [name] is here today for follow up of chronic medical problem.  Outpatient Encounter Prescriptions as of 10/29/2016  Medication Sig  . amLODipine-benazepril (LOTREL) 10-20 MG capsule TAKE 1 CAPSULE BY MOUTH EVERY DAY  . gabapentin (NEURONTIN) 600 MG tablet Take 600 mg by mouth at bedtime.  Marland Kitchen morphine (MSIR) 30 MG tablet Take 30 mg by mouth every 6 (six) hours.  Marland Kitchen oxymorphone (OPANA ER) 30 MG 12 hr tablet Take 30 mg by mouth every 12 (twelve) hours.  Marland Kitchen escitalopram (LEXAPRO) 10 MG tablet Take 1 tablet (10 mg total) by mouth daily. (Patient not taking: Reported on 10/29/2016)  . promethazine (PHENERGAN) 25 MG suppository Place 1 suppository (25 mg total) rectally every 6 (six) hours as needed for nausea or vomiting.      1. Essential hypertension  Patient taking combination Lotrel for blood pressure management.  Patient does not check blood pressure at home.  2. Hypothyroidism, unspecified type  Patient being monitored at this time and no medication.  No new concerns or worsening of symptoms.  3. Thyroid nodule, left Nodule stable, no change.   4. Hyperlipidemia, unspecified hyperlipidemia type  Patient not taking medication at this time.  Monitoring lab values with follow-up visits.  5. Recurrent major depressive disorder, remission status unspecified ([name])  Symptoms managed with escitalopram.    6. Chronic midline low back pain without sciatica  Pain managed by Dr. Hardin Negus at [facility] with morphine and Opana ER.  7. Insomnia, unspecified type Patient takes gabapentin for sleep and is satisfied.    New complaints: None today.  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  Respiratory: Positive for cough (current smoker). Negative for shortness of breath.     Cardiovascular: Negative for chest pain and palpitations.  Gastrointestinal: Negative for abdominal pain.  Musculoskeletal: Positive for back pain (chronic).  Neurological: Negative for dizziness and headaches.  All other systems reviewed and are negative.      Objective:   Physical Exam  Constitutional: She is oriented to person, place, and time. She appears well-developed and well-nourished. No distress.  HENT:  Head: Normocephalic.  Right Ear: External ear normal.  Left Ear: External ear normal.  Mouth/Throat: Oropharynx is clear and moist.  Eyes: Pupils are equal, round, and reactive to light.  Neck: Normal range of motion. Neck supple. No JVD present. No thyromegaly present.  Cardiovascular: Normal rate, regular rhythm, normal heart sounds and intact distal pulses.   No murmur heard. Pulmonary/Chest: Effort normal and breath sounds normal. No respiratory distress. She has no wheezes.  Abdominal: Soft. Bowel sounds are normal. She exhibits no distension. There is no tenderness.  Musculoskeletal: Normal range of motion.  Lymphadenopathy:    She has no cervical adenopathy.  Neurological: She is alert and oriented to person, place, and time.  Skin: Skin is warm and dry.  Psychiatric: She has a normal mood and affect. Her behavior is normal. Judgment and thought content normal.   BP 112/75 (BP Location: Left Arm, Patient Position: Sitting)   Pulse 63   Temp (!) 97 F (36.1 C) (Oral)       Assessment & Plan:  1. Essential hypertension Low sodium diet - amLODipine-benazepril (LOTREL) 10-20 MG capsule; TAKE 1 CAPSULE BY MOUTH EVERY DAY  Dispense: 90 capsule; Refill: 0 - CMP14+EGFR  2. Hypothyroidism, unspecified type   - Thyroid Panel With TSH  3. Thyroid nodule, left - keep appointment to have thyroid nodule rechecked  4. Hyperlipidemia, unspecified hyperlipidemia type Low fat diet - Lipid panel  5.  Recurrent major depressive disorder, in full remission  ([name]) Stress management - escitalopram (LEXAPRO) 10 MG tablet; Take 1 tablet (10 mg total) by mouth daily.  Dispense: 30 tablet; Refill: 5  6. Chronic midline low back pain without sciatica Keep follow up at pain clinic  7. Insomnia, unspecified type Bedtime routine    Labs pending Health maintenance reviewed Diet and exercise encouraged Continue all meds Follow up  In 6 months  [name], FNP

## 2016-10-30 LAB — LIPID PANEL
CHOLESTEROL TOTAL: 167 mg/dL (ref 100–199)
Chol/HDL Ratio: 3.3 ratio (ref 0.0–4.4)
HDL: 50 mg/dL (ref >39)
LDL Calculated: 91 mg/dL (ref 0–99)
Triglycerides: 129 mg/dL (ref 0–149)
VLDL CHOLESTEROL CAL: 26 mg/dL (ref 5–40)

## 2016-10-30 LAB — CMP14+EGFR
ALBUMIN: 4 g/dL (ref 3.5–5.5)
ALK PHOS: 75 IU/L (ref 39–117)
ALT: 10 IU/L (ref 0–32)
AST: 14 IU/L (ref 0–40)
Albumin/Globulin Ratio: 1.6 (ref 1.2–2.2)
BUN/Creatinine Ratio: 8 — ABNORMAL LOW (ref 9–23)
BUN: 5 mg/dL — AB (ref 6–24)
CHLORIDE: 102 mmol/L (ref 96–106)
CO2: 25 mmol/L (ref 20–29)
Calcium: 9 mg/dL (ref 8.7–10.2)
Creatinine, Ser: 0.61 mg/dL (ref 0.57–1.00)
GFR calc non Af Amer: 108 mL/min/{1.73_m2} (ref >59)
GFR, EST AFRICAN AMERICAN: 125 mL/min/{1.73_m2} (ref >59)
GLOBULIN, TOTAL: 2.5 g/dL (ref 1.5–4.5)
GLUCOSE: 89 mg/dL (ref 65–99)
Potassium: 4.1 mmol/L (ref 3.5–5.2)
Sodium: 141 mmol/L (ref 134–144)
TOTAL PROTEIN: 6.5 g/dL (ref 6.0–8.5)

## 2016-10-30 LAB — THYROID PANEL WITH TSH
Free Thyroxine Index: 1.2 (ref 1.2–4.9)
T3 Uptake Ratio: 21 % — ABNORMAL LOW (ref 24–39)
T4, Total: 5.6 ug/dL (ref 4.5–12.0)
TSH: 1.3 u[IU]/mL (ref 0.450–4.500)

## 2016-11-13 DIAGNOSIS — Z9889 Other specified postprocedural states: Secondary | ICD-10-CM | POA: Diagnosis not present

## 2016-11-13 DIAGNOSIS — E042 Nontoxic multinodular goiter: Secondary | ICD-10-CM | POA: Diagnosis not present

## 2016-11-15 ENCOUNTER — Other Ambulatory Visit: Payer: Self-pay | Admitting: Surgery

## 2016-11-15 DIAGNOSIS — E042 Nontoxic multinodular goiter: Secondary | ICD-10-CM

## 2016-11-20 ENCOUNTER — Other Ambulatory Visit: Payer: Commercial Managed Care - HMO

## 2016-11-27 ENCOUNTER — Ambulatory Visit
Admission: RE | Admit: 2016-11-27 | Discharge: 2016-11-27 | Disposition: A | Discharge status: Home / Self Care | Payer: 59 | Source: Ambulatory Visit | Attending: Surgery | Admitting: Surgery

## 2016-11-27 DIAGNOSIS — E042 Nontoxic multinodular goiter: Secondary | ICD-10-CM

## 2016-12-06 DIAGNOSIS — Z79891 Long term (current) use of opiate analgesic: Secondary | ICD-10-CM | POA: Diagnosis not present

## 2016-12-06 DIAGNOSIS — G894 Chronic pain syndrome: Secondary | ICD-10-CM | POA: Diagnosis not present

## 2016-12-06 DIAGNOSIS — M961 Postlaminectomy syndrome, not elsewhere classified: Secondary | ICD-10-CM | POA: Diagnosis not present

## 2016-12-13 ENCOUNTER — Other Ambulatory Visit: Payer: Self-pay | Admitting: Nurse Practitioner

## 2016-12-13 DIAGNOSIS — Z1231 Encounter for screening mammogram for malignant neoplasm of breast: Secondary | ICD-10-CM

## 2016-12-18 ENCOUNTER — Ambulatory Visit (HOSPITAL_COMMUNITY): Payer: 59

## 2016-12-21 ENCOUNTER — Other Ambulatory Visit: Payer: Self-pay | Admitting: Nurse Practitioner

## 2016-12-21 DIAGNOSIS — I10 Essential (primary) hypertension: Secondary | ICD-10-CM

## 2017-01-31 DIAGNOSIS — G894 Chronic pain syndrome: Secondary | ICD-10-CM | POA: Diagnosis not present

## 2017-01-31 DIAGNOSIS — Z79891 Long term (current) use of opiate analgesic: Secondary | ICD-10-CM | POA: Diagnosis not present

## 2017-01-31 DIAGNOSIS — M961 Postlaminectomy syndrome, not elsewhere classified: Secondary | ICD-10-CM | POA: Diagnosis not present

## 2017-03-28 DIAGNOSIS — Z79891 Long term (current) use of opiate analgesic: Secondary | ICD-10-CM | POA: Diagnosis not present

## 2017-03-28 DIAGNOSIS — M961 Postlaminectomy syndrome, not elsewhere classified: Secondary | ICD-10-CM | POA: Diagnosis not present

## 2017-03-28 DIAGNOSIS — G894 Chronic pain syndrome: Secondary | ICD-10-CM | POA: Diagnosis not present

## 2017-05-05 IMAGING — CR DG LUMBAR SPINE 2-3V
2 series · 2 of 2 positions shown · non-contrast
Comparison: 02/06/2016

CLINICAL DATA: LUMBAR SPONDYLOSIS / pending pre-op / hx of back

EXAM:
LUMBAR SPINE - 2-3 VIEW

[w lumbar spine ap]
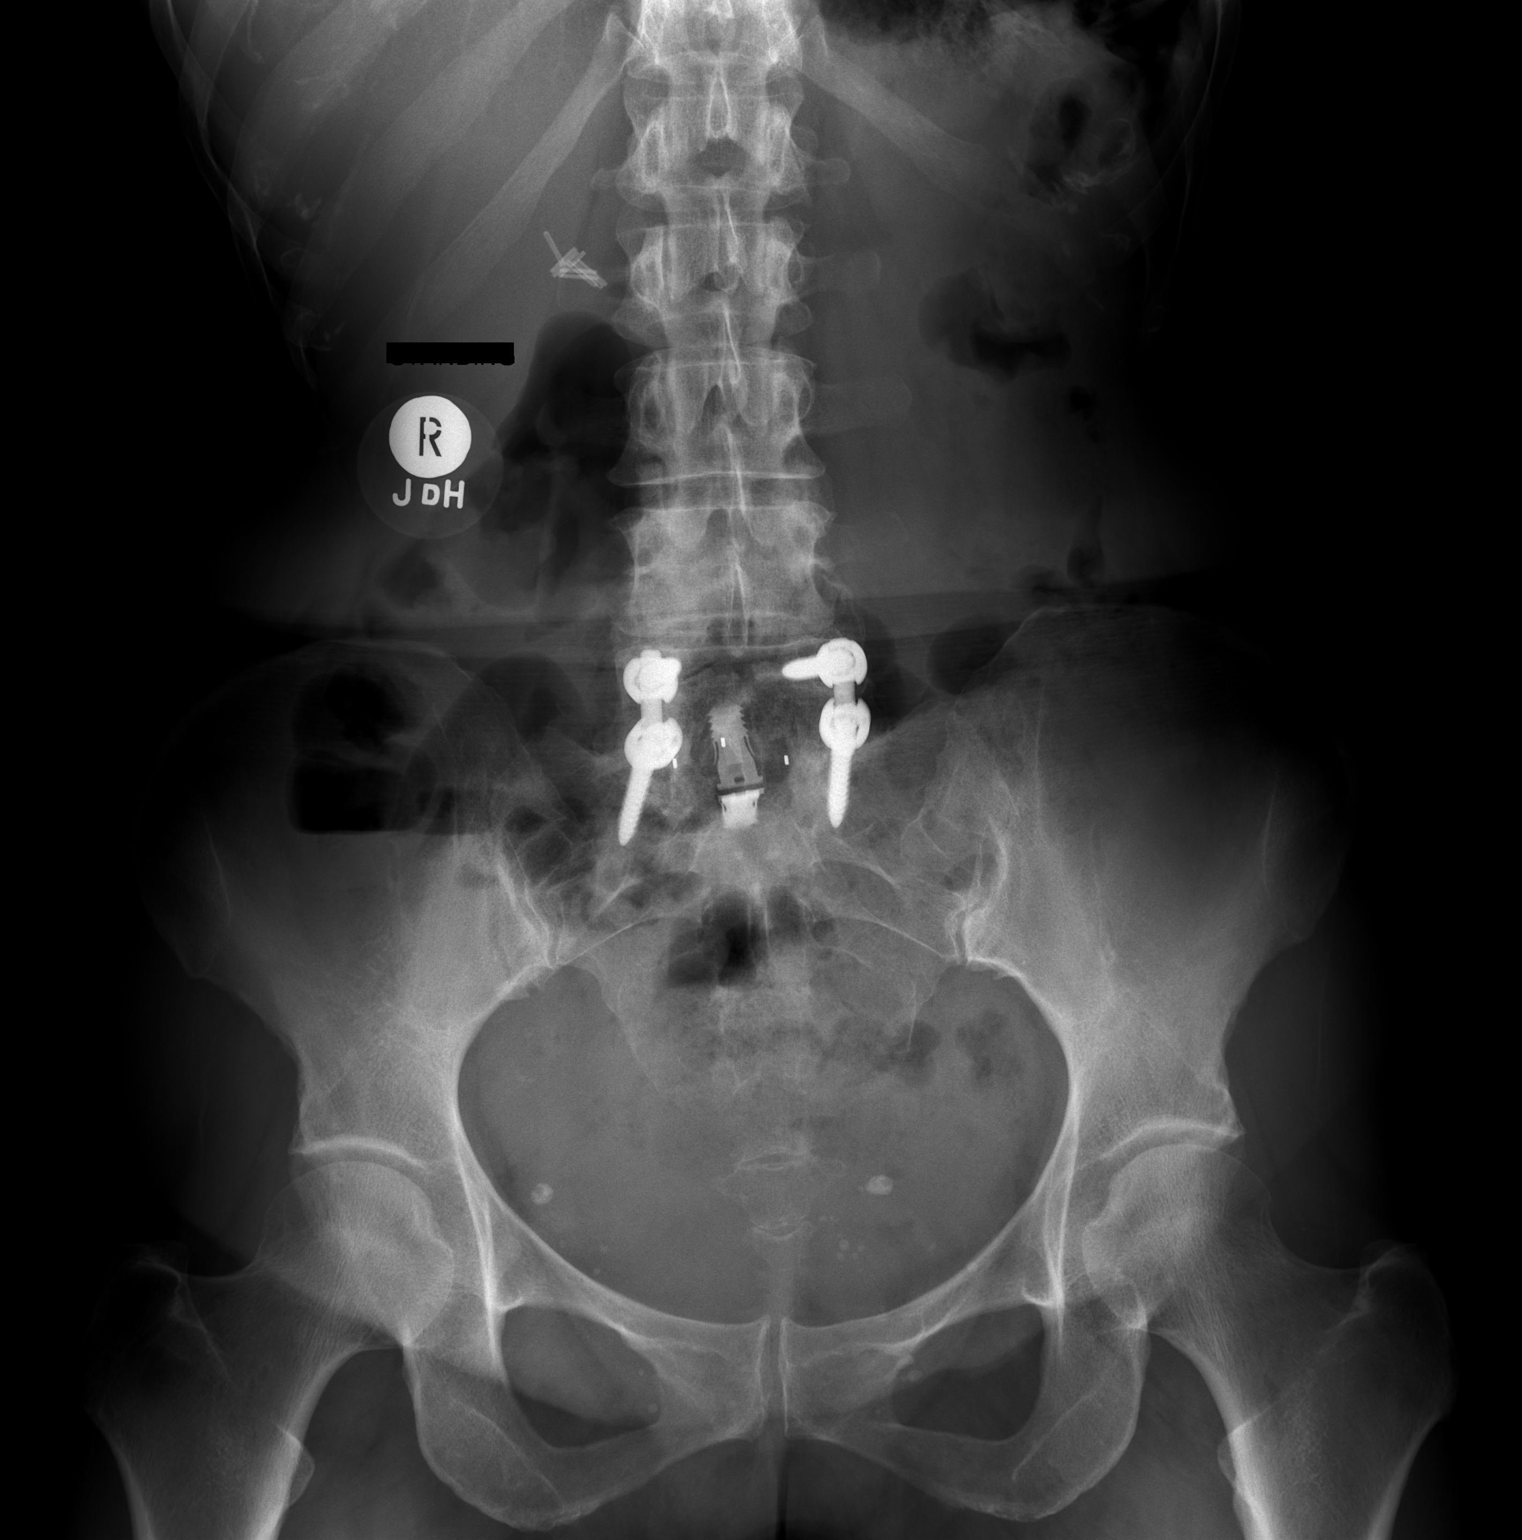

[w lumbar spine lat]
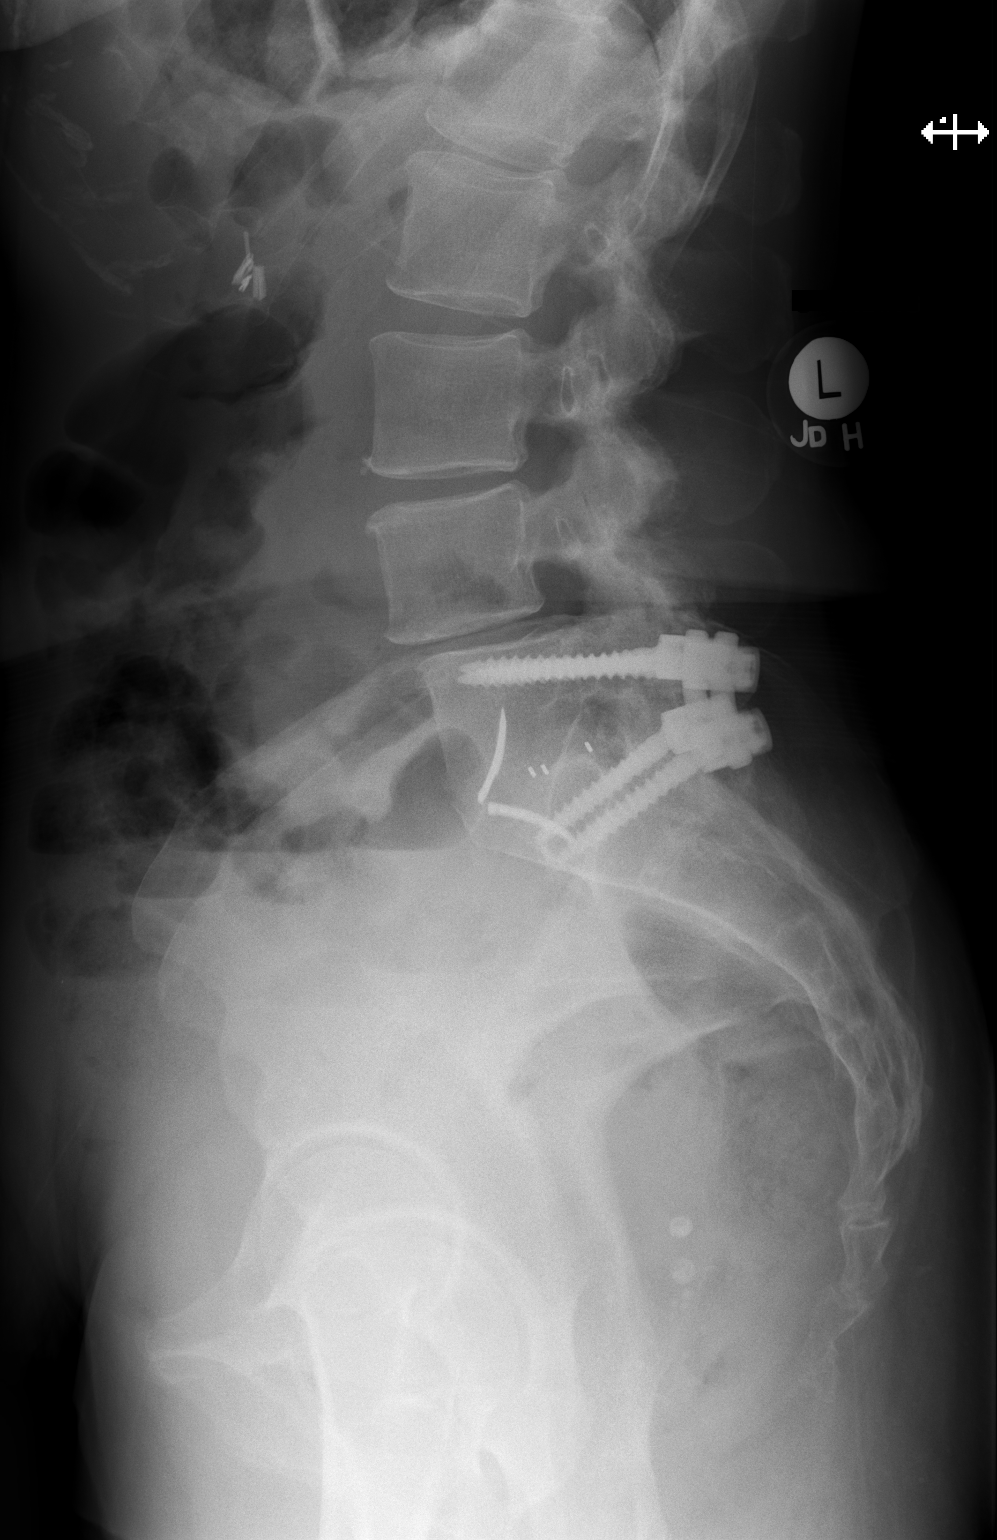

[2 of 2 positions shown; findings below may reference images not displayed]

FINDINGS: Previous PLIF L5-S1 with solid-appearing interbody fusion. Hardware
intact without surrounding lucency. Stable grade 1 anterolisthesis
L4-5. Mild narrowing of L3-4 and L4-5 interspaces. Negative for
fracture or other acute abnormality. Cholecystectomy clips.
Bilateral pelvic phleboliths.
IMPRESSION: 1. Stable postop changes L5-S1.
2. Stable disc narrowing L3-L5 and grade 1 anterolisthesis L4-5.

## 2017-05-29 DIAGNOSIS — M961 Postlaminectomy syndrome, not elsewhere classified: Secondary | ICD-10-CM | POA: Diagnosis not present

## 2017-05-29 DIAGNOSIS — Z79891 Long term (current) use of opiate analgesic: Secondary | ICD-10-CM | POA: Diagnosis not present

## 2017-05-29 DIAGNOSIS — G894 Chronic pain syndrome: Secondary | ICD-10-CM | POA: Diagnosis not present

## 2017-07-24 DIAGNOSIS — G894 Chronic pain syndrome: Secondary | ICD-10-CM | POA: Diagnosis not present

## 2017-07-24 DIAGNOSIS — Z79891 Long term (current) use of opiate analgesic: Secondary | ICD-10-CM | POA: Diagnosis not present

## 2017-07-24 DIAGNOSIS — M961 Postlaminectomy syndrome, not elsewhere classified: Secondary | ICD-10-CM | POA: Diagnosis not present

## 2017-08-09 ENCOUNTER — Other Ambulatory Visit: Payer: Self-pay | Admitting: Nurse Practitioner

## 2017-08-09 DIAGNOSIS — I10 Essential (primary) hypertension: Secondary | ICD-10-CM

## 2017-08-11 NOTE — Telephone Encounter (Signed)
Last refill without being seen 

## 2017-09-18 DIAGNOSIS — M961 Postlaminectomy syndrome, not elsewhere classified: Secondary | ICD-10-CM | POA: Diagnosis not present

## 2017-09-18 DIAGNOSIS — Z79891 Long term (current) use of opiate analgesic: Secondary | ICD-10-CM | POA: Diagnosis not present

## 2017-09-18 DIAGNOSIS — G894 Chronic pain syndrome: Secondary | ICD-10-CM | POA: Diagnosis not present

## 2017-11-13 ENCOUNTER — Other Ambulatory Visit: Payer: Self-pay | Admitting: Nurse Practitioner

## 2017-11-13 DIAGNOSIS — I10 Essential (primary) hypertension: Secondary | ICD-10-CM

## 2017-11-18 DIAGNOSIS — Z79891 Long term (current) use of opiate analgesic: Secondary | ICD-10-CM | POA: Diagnosis not present

## 2017-11-18 DIAGNOSIS — M961 Postlaminectomy syndrome, not elsewhere classified: Secondary | ICD-10-CM | POA: Diagnosis not present

## 2017-11-18 DIAGNOSIS — G894 Chronic pain syndrome: Secondary | ICD-10-CM | POA: Diagnosis not present

## 2018-01-13 DIAGNOSIS — G894 Chronic pain syndrome: Secondary | ICD-10-CM | POA: Diagnosis not present

## 2018-01-13 DIAGNOSIS — Z79891 Long term (current) use of opiate analgesic: Secondary | ICD-10-CM | POA: Diagnosis not present

## 2018-01-13 DIAGNOSIS — M961 Postlaminectomy syndrome, not elsewhere classified: Secondary | ICD-10-CM | POA: Diagnosis not present

## 2018-01-20 ENCOUNTER — Encounter: Payer: 59 | Admitting: Nurse Practitioner

## 2018-02-10 DIAGNOSIS — M961 Postlaminectomy syndrome, not elsewhere classified: Secondary | ICD-10-CM | POA: Diagnosis not present

## 2018-02-10 DIAGNOSIS — Z79891 Long term (current) use of opiate analgesic: Secondary | ICD-10-CM | POA: Diagnosis not present

## 2018-02-10 DIAGNOSIS — G894 Chronic pain syndrome: Secondary | ICD-10-CM | POA: Diagnosis not present

## 2018-02-12 ENCOUNTER — Ambulatory Visit (INDEPENDENT_AMBULATORY_CARE_PROVIDER_SITE_OTHER): Payer: 59 | Admitting: Nurse Practitioner

## 2018-02-12 ENCOUNTER — Encounter: Payer: Self-pay | Admitting: Nurse Practitioner

## 2018-02-12 VITALS — BP 107/67 | HR 68 | Temp 97.0°F | Ht 70.0 in | Wt 171.0 lb

## 2018-02-12 DIAGNOSIS — Z0000 Encounter for general adult medical examination without abnormal findings: Secondary | ICD-10-CM | POA: Diagnosis not present

## 2018-02-12 DIAGNOSIS — F339 Major depressive disorder, recurrent, unspecified: Secondary | ICD-10-CM

## 2018-02-12 DIAGNOSIS — E041 Nontoxic single thyroid nodule: Secondary | ICD-10-CM

## 2018-02-12 DIAGNOSIS — E039 Hypothyroidism, unspecified: Secondary | ICD-10-CM | POA: Diagnosis not present

## 2018-02-12 DIAGNOSIS — G8929 Other chronic pain: Secondary | ICD-10-CM

## 2018-02-12 DIAGNOSIS — I10 Essential (primary) hypertension: Secondary | ICD-10-CM

## 2018-02-12 DIAGNOSIS — Z01419 Encounter for gynecological examination (general) (routine) without abnormal findings: Secondary | ICD-10-CM | POA: Diagnosis not present

## 2018-02-12 DIAGNOSIS — M545 Low back pain, unspecified: Secondary | ICD-10-CM

## 2018-02-12 DIAGNOSIS — E785 Hyperlipidemia, unspecified: Secondary | ICD-10-CM | POA: Diagnosis not present

## 2018-02-12 DIAGNOSIS — Z23 Encounter for immunization: Secondary | ICD-10-CM | POA: Diagnosis not present

## 2018-02-12 DIAGNOSIS — G4700 Insomnia, unspecified: Secondary | ICD-10-CM

## 2018-02-12 LAB — URINALYSIS, COMPLETE
BILIRUBIN UA: NEGATIVE
Glucose, UA: NEGATIVE
Ketones, UA: NEGATIVE
LEUKOCYTES UA: NEGATIVE
Nitrite, UA: NEGATIVE
PH UA: 5.5 (ref 5.0–7.5)
PROTEIN UA: NEGATIVE
SPEC GRAV UA: 1.015 (ref 1.005–1.030)
Urobilinogen, Ur: 0.2 mg/dL (ref 0.2–1.0)

## 2018-02-12 LAB — MICROSCOPIC EXAMINATION: RENAL EPITHEL UA: NONE SEEN /HPF

## 2018-02-12 MED ORDER — AMLODIPINE BESY-BENAZEPRIL HCL 10-20 MG PO CAPS: 1.0000 | ORAL_CAPSULE | Freq: Every day | ORAL | 1 refills | Status: DC

## 2018-02-12 NOTE — Patient Instructions (Addendum)
Back Pain, Adult Back pain is very common. The pain often gets better over time. The cause of back pain is usually not dangerous. Most people can learn to manage their back pain on their own. Follow these instructions at home: Watch your back pain for any changes. The following actions may help to lessen any pain you are feeling:  Stay active. Start with short walks on flat ground if you can. Try to walk farther each day.  Exercise regularly as told by your doctor. Exercise helps your back heal faster. It also helps avoid future injury by keeping your muscles strong and flexible.  Do not sit, drive, or stand in one place for more than 30 minutes.  Do not stay in bed. Resting more than 1-2 days can slow down your recovery.  Be careful when you bend or lift an object. Use good form when lifting: ? Bend at your knees. ? Keep the object close to your body. ? Do not twist.  Sleep on a firm mattress. Lie on your side, and bend your knees. If you lie on your back, put a pillow under your knees.  Take medicines only as told by your doctor.  Put ice on the injured area. ? Put ice in a plastic bag. ? Place a towel between your skin and the bag. ? Leave the ice on for 20 minutes, 2-3 times a day for the first 2-3 days. After that, you can switch between ice and heat packs.  Avoid feeling anxious or stressed. Find good ways to deal with stress, such as exercise.  Maintain a healthy weight. Extra weight puts stress on your back.  Contact a doctor if:  You have pain that does not go away with rest or medicine.  You have worsening pain that goes down into your legs or buttocks.  You have pain that does not get better in one week.  You have pain at night.  You lose weight.  You have a fever or chills. Get help right away if:  You cannot control when you poop (bowel movement) or pee (urinate).  Your arms or legs feel weak.  Your arms or legs lose feeling (numbness).  You feel sick  to your stomach (nauseous) or throw up (vomit).  You have belly (abdominal) pain.  You feel like you may pass out (faint). This information is not intended to replace advice given to you by your health care provider. Make sure you discuss any questions you have with your health care provider. Document Released: 09/04/2007 Document Revised: 08/24/2015 Document Reviewed: 07/20/2013 Elsevier Interactive Patient Education  2018 [employer]  Steps to Quit Smoking Smoking tobacco can be bad for your health. It can also affect almost every organ in your body. Smoking puts you and people around you at risk for many serious long-lasting (chronic) diseases. Quitting smoking is hard, but it is one of the best things that you can do for your health. It is never too late to quit. What are the benefits of quitting smoking? When you quit smoking, you lower your risk for getting serious diseases and conditions. They can include: Lung cancer or lung disease. Heart disease. Stroke. Heart attack. Not being able to have children (infertility). Weak bones (osteoporosis) and broken bones (fractures).  If you have coughing, wheezing, and shortness of breath, those symptoms may get better when you quit. You may also get sick less often. If you are pregnant, quitting smoking can help to lower your chances of having  a baby of low birth weight. What can I do to help me quit smoking? Talk with your doctor about what can help you quit smoking. Some things you can do (strategies) include: Quitting smoking totally, instead of slowly cutting back how much you smoke over a period of time. Going to in-person counseling. You are more likely to quit if you go to many counseling sessions. Using resources and support systems, such as: Online chats with a Veterinary surgeon. Phone quitlines. Printed Materials engineer. Support groups or group counseling. Text messaging programs. Mobile phone apps or applications. Taking  medicines. Some of these medicines may have nicotine in them. If you are pregnant or breastfeeding, do not take any medicines to quit smoking unless your doctor says it is okay. Talk with your doctor about counseling or other things that can help you.  Talk with your doctor about using more than one strategy at the same time, such as taking medicines while you are also going to in-person counseling. This can help make quitting easier. What things can I do to make it easier to quit? Quitting smoking might feel very hard at first, but there is a lot that you can do to make it easier. Take these steps: Talk to your family and friends. Ask them to support and encourage you. Call phone quitlines, reach out to support groups, or work with a Veterinary surgeon. Ask people who smoke to not smoke around you. Avoid places that make you want (trigger) to smoke, such as: Bars. Parties. Smoke-break areas at work. Spend time with people who do not smoke. Lower the stress in your life. Stress can make you want to smoke. Try these things to help your stress: Getting regular exercise. Deep-breathing exercises. Yoga. Meditating. Doing a body scan. To do this, close your eyes, focus on one area of your body at a time from head to toe, and notice which parts of your body are tense. Try to relax the muscles in those areas. Download or buy apps on your mobile phone or tablet that can help you stick to your quit plan. There are many free apps, such as QuitGuide from the [employer] developer for Disease Control and Prevention). You can find more support from smokefree.gov and other websites.  This information is not intended to replace advice given to you by your health care provider. Make sure you discuss any questions you have with your health care provider. Document Released: 01/12/2009 Document Revised: 11/14/2015 Document Reviewed: 08/02/2014 Elsevier Interactive Patient Education  2018 [employer].

## 2018-02-12 NOTE — Addendum Note (Signed)
Addended by: Bennie PieriniMARTIN, MARY-MARGARET on: 02/12/2018 03:51 PM   Modules accepted: Orders

## 2018-02-12 NOTE — Progress Notes (Signed)
Subjective:    Patient ID: Maria [name], female    DOB: Mar 07, 1971, 47 y.o.   MRN: [id number]   Chief Complaint: Annual Exam   HPI:  1. Annual physical exam  -pap today   2. Essential hypertension  -does check BP at home, averages 100s/60s -does not watch salt intake in diet BP Readings from Last 3 Encounters:  02/12/18 107/67  10/29/16 112/75  06/22/15 126/79    3. Hypothyroidism, unspecified type  -no issues she is aware of  4. Hyperlipidemia, unspecified hyperlipidemia type  -does not watch her diet, has poor appetite -not currently on medication  5. Thyroid nodule, left  -followed by Dr. Harlow Asa no need for continued follow-up per patient -cystic in nature per [place] on 11/28/16, right lobe surgically removed  6. Recurrent major depressive disorder, remission status unspecified (HCC)  -symptoms well-controlled, stopped Lexapro  7. Insomnia, unspecified type  -sleeping OK with Neurontin, increased to 958m per Dr. PHardin Negus 8. Chronic midline low back pain without sciatica  -on Opana 577mand Morphine 7.2m17m6, [facility] -currently weaning pain medications with Dr. PhiHardin Negus Outpatient Encounter Medications as of 02/12/2018  Medication Sig  . amLODipine-benazepril (LOTREL) 10-20 MG capsule TAKE 1 CAPSULE BY MOUTH EVERY DAY  . gabapentin (NEURONTIN) 900 MG tablet Take 900 mg by mouth at bedtime.  . mMarland Kitchenrphine (MSIR) 15 MG tablet Take 7.5 mg (1/2 tab) by mouth every 6 (six) hours.  . oMarland Kitchenymorphone (OPANA ER) 5 MG 12 hr tablet Take 5 mg by mouth every 12 (twelve) hours.  . promethazine (PHENERGAN) 25 MG suppository Place 1 suppository (25 mg total) rectally every 6 (six) hours as needed for nausea or vomiting.  . [DISCONTINUED] escitalopram (LEXAPRO) 10 MG tablet Take 1 tablet (10 mg total) by mouth daily.   No facility-administered encounter medications on file as of 02/12/2018.     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  HENT: Negative for congestion, ear pain, rhinorrhea, sinus pressure, sinus pain and sore throat.   Eyes: Negative for pain, redness and visual disturbance.  Respiratory: Positive for cough. Negative for chest tightness, shortness of breath and wheezing.   Cardiovascular: Negative for chest pain, palpitations and leg swelling.  Gastrointestinal: Negative for abdominal pain, constipation, diarrhea, nausea and vomiting.  Endocrine: Positive for cold intolerance. Negative for heat intolerance, polydipsia, polyphagia and polyuria.  Genitourinary: Negative for difficulty urinating, dysuria and urgency.  Musculoskeletal: Negative for arthralgias, gait problem, joint swelling and myalgias.  Skin: Negative for rash and wound.  Allergic/Immunologic: Negative for environmental allergies and food allergies.  Neurological: Negative for dizziness, tremors, weakness and numbness.  Hematological: Does not bruise/bleed easily.  Psychiatric/Behavioral: Positive for sleep disturbance. Negative for behavioral problems, confusion, decreased concentration and suicidal ideas. The patient is not nervous/anxious.        Objective:   Physical Exam  Constitutional: She is oriented to person, place, and time. She appears well-developed and well-nourished.  HENT:  Head: Normocephalic and atraumatic.  Right Ear: External ear normal.  Left Ear: External ear normal.  Nose: Nose normal.  Mouth/Throat: Uvula is midline and mucous membranes are normal. Posterior oropharyngeal erythema present. No oropharyngeal exudate.  Eyes: Pupils are equal, round, and reactive to light. Conjunctivae and EOM are normal.  Neck: Trachea normal, normal range of motion and phonation normal. Neck supple. Carotid bruit is not present.  Thyroid mass present. No thyromegaly present.  Cystic left thyroid nodule,  S/p right thyroid lobectomy   Cardiovascular: Normal rate, regular rhythm, S1 normal, S2 normal, normal heart sounds and intact distal pulses.  Pulmonary/Chest: Effort normal. She has wheezes in the right lower field. Right breast exhibits no mass and no tenderness. Left breast exhibits no mass and no tenderness.    Abdominal: Soft. Bowel sounds are normal.  Genitourinary: Pelvic exam was performed with patient supine. Uterus is deviated (retroverted). Cervix exhibits discharge (scant white-milky). Right adnexum displays no mass and no tenderness. Left adnexum displays no mass and no tenderness.    Musculoskeletal: Normal range of motion.  Neurological: She is alert and oriented to person, place, and time. She displays normal reflexes. No cranial nerve deficit.  Skin: Skin is warm and dry.  Psychiatric: She has a normal mood and affect. Her speech is normal and behavior is normal. Judgment and thought content normal.  Nursing note and vitals reviewed.   BP 107/67   Pulse 68   Temp (!) 97 F (36.1 C) (Oral)   Ht '5\' 10"'  (1.778 m)   Wt 171 lb (77.6 kg)   BMI 24.54 kg/m    U/A +bacteria     Assessment & Plan:  Maria [name] comes in today with chief complaint of Annual Exam   Diagnosis and orders addressed:  1. Annual physical exam - Urinalysis, Complete  2. Essential hypertension Low sodium diet - CMP14+EGFR - amLODipine-benazepril (LOTREL) 10-20 MG capsule; Take 1 capsule by mouth daily.  Dispense: 90 capsule; Refill: 1  3. Hypothyroidism, unspecified type - Thyroid Panel With TSH  4. Hyperlipidemia, unspecified hyperlipidemia type Low fat diet - Lipid panel  5. Thyroid nodule, left Currently watching  6. Recurrent major depressive disorder, remission status unspecified ([place]) Stress management  7. Insomnia, unspecified type Bedtime routine  8. Chronic midline low back pain without  sciatica Keep follow up with neurosurgeon No heavy lifting   Labs pending Health Maintenance reviewed Diet and exercise encouraged  Follow up plan: 6 months   [place], FNP

## 2018-02-13 LAB — CMP14+EGFR
A/G RATIO: 1.7 (ref 1.2–2.2)
ALK PHOS: 69 IU/L (ref 39–117)
ALT: 11 IU/L (ref 0–32)
AST: 15 IU/L (ref 0–40)
Albumin: 4.2 g/dL (ref 3.5–5.5)
BUN/Creatinine Ratio: 17 (ref 9–23)
BUN: 12 mg/dL (ref 6–24)
Bilirubin Total: <0.2 mg/dL (ref 0.0–1.2)
CO2: 23 mmol/L (ref 20–29)
Calcium: 9.7 mg/dL (ref 8.7–10.2)
Chloride: 102 mmol/L (ref 96–106)
Creatinine, Ser: 0.71 mg/dL (ref 0.57–1.00)
GFR calc Af Amer: 116 mL/min/{1.73_m2} (ref >59)
GFR calc non Af Amer: 101 mL/min/{1.73_m2} (ref >59)
GLOBULIN, TOTAL: 2.5 g/dL (ref 1.5–4.5)
Glucose: 76 mg/dL (ref 65–99)
POTASSIUM: 4.3 mmol/L (ref 3.5–5.2)
SODIUM: 141 mmol/L (ref 134–144)
Total Protein: 6.7 g/dL (ref 6.0–8.5)

## 2018-02-13 LAB — LIPID PANEL
CHOL/HDL RATIO: 3 ratio (ref 0.0–4.4)
CHOLESTEROL TOTAL: 184 mg/dL (ref 100–199)
HDL: 61 mg/dL (ref >39)
LDL Calculated: 94 mg/dL (ref 0–99)
TRIGLYCERIDES: 147 mg/dL (ref 0–149)
VLDL Cholesterol Cal: 29 mg/dL (ref 5–40)

## 2018-02-13 LAB — THYROID PANEL WITH TSH
Free Thyroxine Index: 1.6 (ref 1.2–4.9)
T3 UPTAKE RATIO: 24 % (ref 24–39)
T4 TOTAL: 6.5 ug/dL (ref 4.5–12.0)
TSH: 0.954 u[IU]/mL (ref 0.450–4.500)

## 2018-02-17 LAB — IGP, APTIMA HPV, RFX 16/18,45: HPV Aptima: NEGATIVE

## 2018-03-23 DIAGNOSIS — Z79891 Long term (current) use of opiate analgesic: Secondary | ICD-10-CM | POA: Diagnosis not present

## 2018-03-23 DIAGNOSIS — G894 Chronic pain syndrome: Secondary | ICD-10-CM | POA: Diagnosis not present

## 2018-03-23 DIAGNOSIS — M961 Postlaminectomy syndrome, not elsewhere classified: Secondary | ICD-10-CM | POA: Diagnosis not present

## 2018-04-13 ENCOUNTER — Other Ambulatory Visit: Payer: Self-pay | Admitting: Nurse Practitioner

## 2018-04-13 DIAGNOSIS — I10 Essential (primary) hypertension: Secondary | ICD-10-CM

## 2018-05-14 ENCOUNTER — Other Ambulatory Visit: Payer: Self-pay

## 2018-05-14 ENCOUNTER — Other Ambulatory Visit: Payer: Self-pay | Admitting: *Deleted

## 2018-05-14 DIAGNOSIS — I10 Essential (primary) hypertension: Secondary | ICD-10-CM

## 2018-05-14 MED ORDER — AMLODIPINE BESY-BENAZEPRIL HCL 10-20 MG PO CAPS
ORAL_CAPSULE | ORAL | 0 refills | Status: DC
Start: 2018-05-14 — End: 2019-01-07

## 2018-05-19 ENCOUNTER — Other Ambulatory Visit: Payer: Self-pay | Admitting: Nurse Practitioner

## 2018-05-19 DIAGNOSIS — G894 Chronic pain syndrome: Secondary | ICD-10-CM | POA: Diagnosis not present

## 2018-05-19 DIAGNOSIS — R232 Flushing: Secondary | ICD-10-CM

## 2018-05-19 DIAGNOSIS — Z79891 Long term (current) use of opiate analgesic: Secondary | ICD-10-CM | POA: Diagnosis not present

## 2018-05-19 DIAGNOSIS — M961 Postlaminectomy syndrome, not elsewhere classified: Secondary | ICD-10-CM | POA: Diagnosis not present

## 2018-05-19 MED ORDER — CONJ ESTROG-MEDROXYPROGEST ACE 0.3-1.5 MG PO TABS: 1.0000 | ORAL_TABLET | Freq: Every day | ORAL | 1 refills | Status: DC

## 2018-05-21 ENCOUNTER — Other Ambulatory Visit (HOSPITAL_COMMUNITY): Payer: Self-pay | Admitting: Nurse Practitioner

## 2018-05-21 DIAGNOSIS — Z1231 Encounter for screening mammogram for malignant neoplasm of breast: Secondary | ICD-10-CM

## 2018-06-05 ENCOUNTER — Ambulatory Visit (HOSPITAL_COMMUNITY)
Admission: RE | Admit: 2018-06-05 | Discharge: 2018-06-05 | Disposition: A | Discharge status: Home / Self Care | Payer: 59 | Source: Ambulatory Visit | Attending: Nurse Practitioner | Admitting: Nurse Practitioner

## 2018-06-05 DIAGNOSIS — Z1231 Encounter for screening mammogram for malignant neoplasm of breast: Secondary | ICD-10-CM | POA: Diagnosis present

## 2018-07-30 DIAGNOSIS — Z79891 Long term (current) use of opiate analgesic: Secondary | ICD-10-CM | POA: Diagnosis not present

## 2018-07-30 DIAGNOSIS — G894 Chronic pain syndrome: Secondary | ICD-10-CM | POA: Diagnosis not present

## 2018-07-30 DIAGNOSIS — M961 Postlaminectomy syndrome, not elsewhere classified: Secondary | ICD-10-CM | POA: Diagnosis not present

## 2018-09-15 ENCOUNTER — Other Ambulatory Visit: Payer: Self-pay | Admitting: Family Medicine

## 2018-09-15 DIAGNOSIS — B001 Herpesviral vesicular dermatitis: Secondary | ICD-10-CM

## 2018-09-15 MED ORDER — VALACYCLOVIR HCL 1 G PO TABS
2000.0000 mg | ORAL_TABLET | Freq: Every day | ORAL | 6 refills | Status: AC
Start: 2018-09-15 — End: 2018-09-18

## 2018-11-16 ENCOUNTER — Other Ambulatory Visit: Payer: Self-pay | Admitting: Nurse Practitioner

## 2018-11-16 DIAGNOSIS — R232 Flushing: Secondary | ICD-10-CM

## 2019-01-04 ENCOUNTER — Other Ambulatory Visit: Payer: Self-pay

## 2019-01-06 ENCOUNTER — Other Ambulatory Visit: Payer: Self-pay

## 2019-01-07 ENCOUNTER — Encounter: Payer: Self-pay | Admitting: Nurse Practitioner

## 2019-01-07 ENCOUNTER — Ambulatory Visit: Payer: 59 | Admitting: Nurse Practitioner

## 2019-01-07 VITALS — BP 123/87 | HR 78 | Temp 97.5°F | Resp 18 | Wt 187.0 lb

## 2019-01-07 DIAGNOSIS — G8929 Other chronic pain: Secondary | ICD-10-CM

## 2019-01-07 DIAGNOSIS — M545 Low back pain, unspecified: Secondary | ICD-10-CM

## 2019-01-07 DIAGNOSIS — I10 Essential (primary) hypertension: Secondary | ICD-10-CM | POA: Diagnosis not present

## 2019-01-07 DIAGNOSIS — F339 Major depressive disorder, recurrent, unspecified: Secondary | ICD-10-CM

## 2019-01-07 DIAGNOSIS — G2581 Restless legs syndrome: Secondary | ICD-10-CM

## 2019-01-07 DIAGNOSIS — E039 Hypothyroidism, unspecified: Secondary | ICD-10-CM | POA: Diagnosis not present

## 2019-01-07 DIAGNOSIS — E785 Hyperlipidemia, unspecified: Secondary | ICD-10-CM

## 2019-01-07 DIAGNOSIS — E041 Nontoxic single thyroid nodule: Secondary | ICD-10-CM

## 2019-01-07 DIAGNOSIS — G4700 Insomnia, unspecified: Secondary | ICD-10-CM

## 2019-01-07 MED ORDER — AMLODIPINE BESY-BENAZEPRIL HCL 10-20 MG PO CAPS: ORAL_CAPSULE | ORAL | 1 refills | Status: DC

## 2019-01-07 MED ORDER — PRAMIPEXOLE DIHYDROCHLORIDE 0.5 MG PO TABS: 0.5000 mg | ORAL_TABLET | Freq: Three times a day (TID) | ORAL | 5 refills | Status: DC

## 2019-01-07 NOTE — Patient Instructions (Signed)

## 2019-01-07 NOTE — Progress Notes (Signed)
Subjective:    Patient ID: Maria [name], female    DOB: Jun 14, 1970, 48 y.o.   MRN: [id number]   Chief Complaint: medical management of chronic issues   HPI:  1. Essential hypertension No c/o chest pain, sob or headache.does not check blood pressure at home. BP Readings from Last 3 Encounters:  02/12/18 107/67  10/29/16 112/75  06/22/15 126/79     2. Hyperlipidemia, unspecified hyperlipidemia type De not watch diet and does very little exercise Lab Results  Component Value Date   CHOL 184 02/12/2018   HDL 61 02/12/2018   LDLCALC 94 02/12/2018   TRIG 147 02/12/2018   CHOLHDL 3.0 02/12/2018     3. Hypothyroidism, unspecified type No problems that Maria [name] is aware Lab Results  Component Value Date   TSH 0.954 02/12/2018     4. Recurrent major depressive disorder, remission status unspecified ([place]) Denies any recent depression.Maria [name] says Maria [name] is doing well. Depression screen [facility] 2/9 01/07/2019 02/12/2018 10/29/2016  Decreased Interest 0 0 0  Down, Depressed, Hopeless 0 0 0  PHQ - 2 Score 0 0 0  Altered sleeping - - 0  Tired, decreased energy - - 0  Change in appetite - - 0  Feeling bad or failure about yourself  - - 0  Trouble concentrating - - 0  Moving slowly or fidgety/restless - - 0  Suicidal thoughts - - 0  PHQ-9 Score - - 0     5. Thyroid nodule, left No problems that Maria [name] is aware of. No trouble swallowing.  6. Chronic midline low back pain without sciatica Has chronic low back pain. Maria [name] [place] eto be on narcotic pain meds bbut was able to wean herself off. Maria [name] is now only on neurontin and is doing well. Still has some pain but is bearable  7. Insomnia, unspecified type No problems sleep. The neurontin helps her sleep    Outpatient Encounter Medications as of 01/07/2019  Medication Sig  . amLODipine-benazepril (LOTREL) 10-20 MG capsule TAKE 1 CAPSULE BY MOUTH EVERY DAY  . estrogen, conjugated,-medroxyprogesterone (PREMPRO) 0.3-1.5 MG tablet Take 1 tablet  by mouth daily. (Needs to be seen before next refill)  . gabapentin (NEURONTIN) 600 MG tablet Take 900 mg by mouth at bedtime.  Marland Kitchen morphine (MSIR) 15 MG tablet TK 1/2 T PO Q 6 H PRN P  . oxymorphone (OPANA ER) 5 MG 12 hr tablet TK 1 T PO Q 12 H  . promethazine (PHENERGAN) 25 MG suppository Place 1 suppository (25 mg total) rectally every 6 (six) hours as needed for nausea or vomiting.    Past Surgical History:  Procedure Laterality Date  . BREAST SURGERY     lumpectomy  . CESAREAN SECTION    . CHOLECYSTECTOMY    . ENDOMETRIAL ABLATION    . l5-s1 fusion  2008, 2009, 2011  . THYROIDECTOMY Right 2001    Family History  Problem Relation Age of Onset  . Hypertension Mother   . Cancer Mother        breast  . Hypothyroidism Mother   . Hypertension Father   . Hypothyroidism Father     New complaints: Has had restless leg syndrome for over a year andis getting worse at night.  Social history: Lives with husband - no longer works.  Controlled substance contract: n/a    Review of Systems  Constitutional: Negative for activity change and appetite change.  HENT: Negative.   Eyes: Negative for pain.  Respiratory: Negative for shortness of breath.  Cardiovascular: Negative for chest pain, palpitations and leg swelling.  Gastrointestinal: Negative for abdominal pain.  Endocrine: Negative for polydipsia.  Genitourinary: Negative.   Skin: Negative for rash.  Neurological: Negative for dizziness, weakness and headaches.  Hematological: Does not bruise/bleed easily.  Psychiatric/Behavioral: Negative.   All other systems reviewed and are negative.      Objective:   Physical Exam Vitals signs and nursing note reviewed.  Constitutional:      General: Maria [name] is not in acute distress.    Appearance: Normal appearance. Maria [name] is well-developed.  HENT:     Head: Normocephalic.     Nose: Nose normal.  Eyes:     Pupils: Pupils are equal, round, and reactive to light.  Neck:      Musculoskeletal: Normal range of motion and neck supple.     Vascular: No carotid bruit or JVD.  Cardiovascular:     Rate and Rhythm: Normal rate and regular rhythm.     Heart sounds: Normal heart sounds.  Pulmonary:     Effort: Pulmonary effort is normal. No respiratory distress.     Breath sounds: Normal breath sounds. No wheezing or rales.  Chest:     Chest wall: No tenderness.  Abdominal:     General: Bowel sounds are normal. There is no distension or abdominal bruit.     Palpations: Abdomen is soft. There is no hepatomegaly, splenomegaly, mass or pulsatile mass.     Tenderness: There is no abdominal tenderness.  Musculoskeletal: Normal range of motion.  Lymphadenopathy:     Cervical: No cervical adenopathy.  Skin:    General: Skin is warm and dry.  Neurological:     Mental Status: Maria [name] is alert and oriented to person, place, and time.     Deep Tendon Reflexes: Reflexes are normal and symmetric.  Psychiatric:        Behavior: Behavior normal.        Thought Content: Thought content normal.        Judgment: Judgment normal.    BP 123/87   Pulse 78   Temp (!) 97.5 F (36.4 C) (Temporal)   Resp 18   Wt 187 lb (84.8 kg)   SpO2 95%   BMI 26.83 kg/m         Assessment & Plan:  Maria [name] comes in today with chief complaint of Medical Management of Chronic Issues   Diagnosis and orders addressed:  1. Essential hypertension Low sodium diet - amLODipine-benazepril (LOTREL) 10-20 MG capsule; TAKE 1 CAPSULE BY MOUTH EVERY DAY  Dispense: 90 capsule; Refill: 1 - CMP14+EGFR  2. Hyperlipidemia, unspecified hyperlipidemia type Low fat diet - Lipid panel  3. Hypothyroidism, unspecified type - Thyroid Panel With TSH  4. Recurrent major depressive disorder, remission status unspecified ([place]) stress management  5. Thyroid nodule, left  6. Chronic midline low back pain without sciatica  7. Insomnia, unspecified type Bedtime routine  8. Restless leg Keep legs  warm at night - pramipexole (MIRAPEX) 0.5 MG tablet; Take 1 tablet (0.5 mg total) by mouth 3 (three) times daily.  Dispense: 30 tablet; Refill: 5   Labs pending Health Maintenance reviewed Diet and exercise encouraged  Follow up plan: 6 months   Mary-Margaret Hassell Done, FNP

## 2019-01-08 LAB — CMP14+EGFR
ALT: 9 IU/L (ref 0–32)
AST: 19 IU/L (ref 0–40)
Albumin/Globulin Ratio: 1.7 (ref 1.2–2.2)
Albumin: 4.3 g/dL (ref 3.8–4.8)
Alkaline Phosphatase: 87 IU/L (ref 39–117)
BUN/Creatinine Ratio: 14 (ref 9–23)
BUN: 11 mg/dL (ref 6–24)
Bilirubin Total: 0.3 mg/dL (ref 0.0–1.2)
CO2: 22 mmol/L (ref 20–29)
Calcium: 9.5 mg/dL (ref 8.7–10.2)
Chloride: 104 mmol/L (ref 96–106)
Creatinine, Ser: 0.78 mg/dL (ref 0.57–1.00)
GFR calc Af Amer: 103 mL/min/{1.73_m2} (ref >59)
GFR calc non Af Amer: 90 mL/min/{1.73_m2} (ref >59)
Globulin, Total: 2.6 g/dL (ref 1.5–4.5)
Glucose: 94 mg/dL (ref 65–99)
Potassium: 3.9 mmol/L (ref 3.5–5.2)
Sodium: 137 mmol/L (ref 134–144)
Total Protein: 6.9 g/dL (ref 6.0–8.5)

## 2019-01-08 LAB — LIPID PANEL
Chol/HDL Ratio: 3.8 ratio (ref 0.0–4.4)
Cholesterol, Total: 230 mg/dL — ABNORMAL HIGH (ref 100–199)
HDL: 60 mg/dL (ref >39)
LDL Chol Calc (NIH): 153 mg/dL — ABNORMAL HIGH (ref 0–99)
Triglycerides: 98 mg/dL (ref 0–149)
VLDL Cholesterol Cal: 17 mg/dL (ref 5–40)

## 2019-01-08 LAB — THYROID PANEL WITH TSH
Free Thyroxine Index: 1.3 (ref 1.2–4.9)
T3 Uptake Ratio: 21 % — ABNORMAL LOW (ref 24–39)
T4, Total: 6.3 ug/dL (ref 4.5–12.0)
TSH: 0.608 u[IU]/mL (ref 0.450–4.500)

## 2019-01-25 ENCOUNTER — Other Ambulatory Visit: Payer: Self-pay | Admitting: Nurse Practitioner

## 2019-01-25 ENCOUNTER — Other Ambulatory Visit: Payer: Self-pay

## 2019-01-25 DIAGNOSIS — R232 Flushing: Secondary | ICD-10-CM

## 2019-01-25 MED ORDER — PREMPRO 0.3-1.5 MG PO TABS: 1.0000 | ORAL_TABLET | Freq: Every day | ORAL | 0 refills | Status: DC

## 2019-01-25 MED ORDER — TIZANIDINE HCL 4 MG PO TABS: 6.0000 mg | ORAL_TABLET | Freq: Three times a day (TID) | ORAL | 1 refills | Status: DC

## 2019-01-25 MED ORDER — GABAPENTIN 400 MG PO CAPS: 1200.0000 mg | ORAL_CAPSULE | Freq: Every day | ORAL | 1 refills | Status: DC

## 2019-03-11 ENCOUNTER — Other Ambulatory Visit: Payer: Self-pay | Admitting: Nurse Practitioner

## 2019-03-11 DIAGNOSIS — R232 Flushing: Secondary | ICD-10-CM

## 2019-03-24 ENCOUNTER — Other Ambulatory Visit: Payer: Self-pay | Admitting: Nurse Practitioner

## 2019-03-24 DIAGNOSIS — G2581 Restless legs syndrome: Secondary | ICD-10-CM

## 2019-04-30 ENCOUNTER — Other Ambulatory Visit: Payer: Self-pay | Admitting: Family Medicine

## 2019-04-30 DIAGNOSIS — G2581 Restless legs syndrome: Secondary | ICD-10-CM

## 2019-05-05 ENCOUNTER — Other Ambulatory Visit (HOSPITAL_COMMUNITY): Payer: Self-pay | Admitting: Nurse Practitioner

## 2019-05-05 DIAGNOSIS — Z1231 Encounter for screening mammogram for malignant neoplasm of breast: Secondary | ICD-10-CM

## 2019-05-28 ENCOUNTER — Other Ambulatory Visit: Payer: Self-pay | Admitting: Nurse Practitioner

## 2019-05-28 ENCOUNTER — Other Ambulatory Visit: Payer: Self-pay | Admitting: Family Medicine

## 2019-05-28 DIAGNOSIS — G2581 Restless legs syndrome: Secondary | ICD-10-CM

## 2019-06-09 ENCOUNTER — Other Ambulatory Visit: Payer: Self-pay

## 2019-06-09 ENCOUNTER — Ambulatory Visit (HOSPITAL_COMMUNITY)
Admission: RE | Admit: 2019-06-09 | Discharge: 2019-06-09 | Disposition: A | Discharge status: Home / Self Care | Payer: 59 | Source: Ambulatory Visit | Attending: Nurse Practitioner | Admitting: Nurse Practitioner

## 2019-06-09 DIAGNOSIS — Z1231 Encounter for screening mammogram for malignant neoplasm of breast: Secondary | ICD-10-CM | POA: Diagnosis not present

## 2019-06-19 ENCOUNTER — Other Ambulatory Visit: Payer: Self-pay | Admitting: Family Medicine

## 2019-06-19 DIAGNOSIS — G2581 Restless legs syndrome: Secondary | ICD-10-CM

## 2019-07-08 ENCOUNTER — Other Ambulatory Visit: Payer: Self-pay | Admitting: Nurse Practitioner

## 2019-07-08 DIAGNOSIS — R232 Flushing: Secondary | ICD-10-CM

## 2019-07-09 ENCOUNTER — Ambulatory Visit: Payer: 59 | Admitting: Nurse Practitioner

## 2019-07-09 ENCOUNTER — Other Ambulatory Visit: Payer: Self-pay

## 2019-07-09 ENCOUNTER — Encounter: Payer: Self-pay | Admitting: Nurse Practitioner

## 2019-07-09 VITALS — BP 115/67 | HR 71 | Temp 98.9°F | Resp 20 | Ht 70.0 in | Wt 195.0 lb

## 2019-07-09 DIAGNOSIS — G8929 Other chronic pain: Secondary | ICD-10-CM

## 2019-07-09 DIAGNOSIS — I10 Essential (primary) hypertension: Secondary | ICD-10-CM

## 2019-07-09 DIAGNOSIS — E039 Hypothyroidism, unspecified: Secondary | ICD-10-CM

## 2019-07-09 DIAGNOSIS — G2581 Restless legs syndrome: Secondary | ICD-10-CM

## 2019-07-09 DIAGNOSIS — E041 Nontoxic single thyroid nodule: Secondary | ICD-10-CM

## 2019-07-09 DIAGNOSIS — M545 Low back pain, unspecified: Secondary | ICD-10-CM

## 2019-07-09 DIAGNOSIS — R609 Edema, unspecified: Secondary | ICD-10-CM

## 2019-07-09 DIAGNOSIS — E785 Hyperlipidemia, unspecified: Secondary | ICD-10-CM | POA: Diagnosis not present

## 2019-07-09 DIAGNOSIS — F339 Major depressive disorder, recurrent, unspecified: Secondary | ICD-10-CM

## 2019-07-09 DIAGNOSIS — R232 Flushing: Secondary | ICD-10-CM

## 2019-07-09 DIAGNOSIS — G4700 Insomnia, unspecified: Secondary | ICD-10-CM

## 2019-07-09 MED ORDER — FUROSEMIDE 20 MG PO TABS: 20.0000 mg | ORAL_TABLET | Freq: Every day | ORAL | 3 refills | Status: DC

## 2019-07-09 MED ORDER — PREMPRO 0.45-1.5 MG PO TABS: 1.0000 | ORAL_TABLET | Freq: Every day | ORAL | 1 refills | Status: DC

## 2019-07-09 MED ORDER — AMLODIPINE BESY-BENAZEPRIL HCL 10-20 MG PO CAPS: ORAL_CAPSULE | ORAL | 1 refills | Status: DC

## 2019-07-09 MED ORDER — PRAMIPEXOLE DIHYDROCHLORIDE 0.5 MG PO TABS: 0.5000 mg | ORAL_TABLET | Freq: Three times a day (TID) | ORAL | 0 refills | Status: DC

## 2019-07-09 MED ORDER — GABAPENTIN 400 MG PO CAPS: 1200.0000 mg | ORAL_CAPSULE | Freq: Every day | ORAL | 1 refills | Status: DC

## 2019-07-09 NOTE — Patient Instructions (Signed)
Exercising to Stay Healthy To become healthy and stay healthy, it is recommended that you do moderate-intensity and vigorous-intensity exercise. You can tell that you are exercising at a moderate intensity if your heart starts beating faster and you start breathing faster but can still hold a conversation. You can tell that you are exercising at a vigorous intensity if you are breathing much harder and faster and cannot hold a conversation while exercising. Exercising regularly is important. It has many health benefits, such as:  Improving overall fitness, flexibility, and endurance.  Increasing bone density.  Helping with weight control.  Decreasing body fat.  Increasing muscle strength.  Reducing stress and tension.  Improving overall health. How often should I exercise? Choose an activity that you enjoy, and set realistic goals. Your health care provider can help you make an activity plan that works for you. Exercise regularly as told by your health care provider. This may include:  Doing strength training two times a week, such as: ? Lifting weights. ? Using resistance bands. ? Push-ups. ? Sit-ups. ? Yoga.  Doing a certain intensity of exercise for a given amount of time. Choose from these options: ? A total of 150 minutes of moderate-intensity exercise every week. ? A total of 75 minutes of vigorous-intensity exercise every week. ? A mix of moderate-intensity and vigorous-intensity exercise every week. Children, pregnant women, people who have not exercised regularly, people who are overweight, and older adults may need to talk with a health care provider about what activities are safe to do. If you have a medical condition, be sure to talk with your health care provider before you start a new exercise program. What are some exercise ideas? Moderate-intensity exercise ideas include:  Walking 1 mile (1.6 km) in about 15  minutes.  Biking.  Hiking.  Golfing.  Dancing.  Water aerobics. Vigorous-intensity exercise ideas include:  Walking 4.5 miles (7.2 km) or more in about 1 hour.  Jogging or running 5 miles (8 km) in about 1 hour.  Biking 10 miles (16.1 km) or more in about 1 hour.  Lap swimming.  Roller-skating or in-line skating.  Cross-country skiing.  Vigorous competitive sports, such as football, basketball, and soccer.  Jumping rope.  Aerobic dancing. What are some everyday activities that can help me to get exercise?  Yard work, such as: ? Pushing a lawn mower. ? Raking and bagging leaves.  Washing your car.  Pushing a stroller.  Shoveling snow.  Gardening.  Washing windows or floors. How can I be more active in my day-to-day activities?  Use stairs instead of an elevator.  Take a walk during your lunch break.  If you drive, park your car farther away from your work or school.  If you take public transportation, get off one stop early and walk the rest of the way.  Stand up or walk around during all of your indoor phone calls.  Get up, stretch, and walk around every 30 minutes throughout the day.  Enjoy exercise with a friend. Support to continue exercising will help you keep a regular routine of activity. What guidelines can I follow while exercising?  Before you start a new exercise program, talk with your health care provider.  Do not exercise so much that you hurt yourself, feel dizzy, or get very short of breath.  Wear comfortable clothes and wear shoes with good support.  Drink plenty of water while you exercise to prevent dehydration or heat stroke.  Work out until your breathing   and your heartbeat get faster. Where to find more information  [place] Department of Health and Human Services: www.hhs.gov  Centers for Disease Control and Prevention (CDC): www.cdc.gov Summary  Exercising regularly is important. It will improve your overall fitness,  flexibility, and endurance.  Regular exercise also will improve your overall health. It can help you control your weight, reduce stress, and improve your bone density.  Do not exercise so much that you hurt yourself, feel dizzy, or get very short of breath.  Before you start a new exercise program, talk with your health care provider. This information is not intended to replace advice given to you by your health care provider. Make sure you discuss any questions you have with your health care provider. Document Revised: 02/28/2017 Document Reviewed: 02/06/2017 Elsevier Patient Education  2020 [employer] Inc.  

## 2019-07-09 NOTE — Addendum Note (Signed)
Addended by: Bennie Pierini on: 07/09/2019 11:23 AM   Modules accepted: Orders

## 2019-07-09 NOTE — Progress Notes (Signed)
Subjective:    Patient ID: Maria [name], female    DOB: [birth year], 49 y.o.   MRN: [id number]   Chief Complaint: Medical Management of Chronic Issues    HPI:  1. Essential hypertension No c/o chest pain, sob or headache. Does not check blood pressure at home. BP Readings from Last 3 Encounters:  01/07/19 123/87  02/12/18 107/67  10/29/16 112/75     2. Hyperlipidemia, unspecified hyperlipidemia type Does watch diet and stays very active. Lab Results  Component Value Date   CHOL 230 (H) 01/07/2019   HDL 60 01/07/2019   LDLCALC 153 (H) 01/07/2019   TRIG 98 01/07/2019   CHOLHDL 3.8 01/07/2019     3. Hypothyroidism, unspecified type No problems that aware of. Lab Results  Component Value Date   TSH 0.608 01/07/2019     4. Recurrent major depressive disorder, remission status unspecified ([place]) Is on no antidepressants and is doing well. Depression screen [facility] 2/9 07/09/2019 01/07/2019 02/12/2018  Decreased Interest 0 0 0  Down, Depressed, Hopeless 0 0 0  PHQ - 2 Score 0 0 0  Altered sleeping - - -  Tired, decreased energy - - -  Change in appetite - - -  Feeling bad or failure about yourself  - - -  Trouble concentrating - - -  Moving slowly or fidgety/restless - - -  Suicidal thoughts - - -  PHQ-9 Score - - -     5. Insomnia, unspecified type Most nights but does have trouble falling asleep.says she feels rested in the mornings  6. Thyroid nodule, left No problems- no change in nodules  7. Chronic midline low back pain without sciatica currently having  No back pain  8. RLS Helps a lot. Has to take 2x a day.   Outpatient Encounter Medications as of 07/09/2019  Medication Sig  . amLODipine-benazepril (LOTREL) 10-20 MG capsule TAKE 1 CAPSULE BY MOUTH EVERY DAY  . estrogen, conjugated,-medroxyprogesterone (PREMPRO) 0.3-1.5 MG tablet Take 1 tablet by mouth daily.  Marland Kitchen gabapentin (NEURONTIN) 400 MG capsule Take 3 capsules (1,200 mg total) by mouth at  bedtime.  . pramipexole (MIRAPEX) 0.5 MG tablet TAKE 1 TABLET (0.5 MG TOTAL) BY MOUTH 3 (THREE) TIMES DAILY.  Marland Kitchen tiZANidine (ZANAFLEX) 4 MG tablet TAKE 1.5 TABLETS (6 MG TOTAL) BY MOUTH 3 (THREE) TIMES DAILY.     Past Surgical History:  Procedure Laterality Date  . BACK SURGERY  2018  . BREAST SURGERY     lumpectomy  . CESAREAN SECTION    . CHOLECYSTECTOMY    . ENDOMETRIAL ABLATION    . l5-s1 fusion  2008, 2009, 2011  . THYROIDECTOMY Right 2001    Family History  Problem Relation Age of Onset  . Hypertension Mother   . Cancer Mother        breast  . Hypothyroidism Mother   . Hypertension Father   . Hypothyroidism Father     New complaints: Has been having hot flashes- prempro not helping  Social history: Lives with her husbands and recently changed jobs  Controlled substance contract: n/a    Review of Systems  Constitutional: Negative for diaphoresis.  Eyes: Negative for pain.  Respiratory: Negative for shortness of breath.   Cardiovascular: Negative for chest pain, palpitations and leg swelling.  Gastrointestinal: Negative for abdominal pain.  Endocrine: Negative for polydipsia.  Skin: Negative for rash.  Neurological: Negative for dizziness, weakness and headaches.  Hematological: Does not bruise/bleed easily.  All other systems reviewed and  are negative.      Objective:   Physical Exam Vitals and nursing note reviewed.  Constitutional:      General: She is not in acute distress.    Appearance: Normal appearance. She is well-developed.  HENT:     Head: Normocephalic.     Nose: Nose normal.  Eyes:     Pupils: Pupils are equal, round, and reactive to light.  Neck:     Vascular: No carotid bruit or JVD.  Cardiovascular:     Rate and Rhythm: Normal rate and regular rhythm.     Heart sounds: Normal heart sounds.  Pulmonary:     Effort: Pulmonary effort is normal. No respiratory distress.     Breath sounds: Normal breath sounds. No wheezing or rales.    Chest:     Chest wall: No tenderness.  Abdominal:     General: Bowel sounds are normal. There is no distension or abdominal bruit.     Palpations: Abdomen is soft. There is no hepatomegaly, splenomegaly, mass or pulsatile mass.     Tenderness: There is no abdominal tenderness.  Musculoskeletal:        General: Normal range of motion.     Cervical back: Normal range of motion and neck supple.  Lymphadenopathy:     Cervical: No cervical adenopathy.  Skin:    General: Skin is warm and dry.  Neurological:     Mental Status: She is alert and oriented to person, place, and time.     Deep Tendon Reflexes: Reflexes are normal and symmetric.  Psychiatric:        Behavior: Behavior normal.        Thought Content: Thought content normal.        Judgment: Judgment normal.    BP 115/67   Pulse 71   Temp 98.9 F (37.2 C) (Temporal)   Resp 20   Ht 5\' 10"  (1.778 m)   Wt 195 lb (88.5 kg)   SpO2 98%   BMI 27.98 kg/m         Assessment & Plan:  Maria [name] comes in today with chief complaint of Medical Management of Chronic Issues   Diagnosis and orders addressed:  1. Essential hypertension Low sodium diet - amLODipine-benazepril (LOTREL) 10-20 MG capsule; TAKE 1 CAPSULE BY MOUTH EVERY DAY  Dispense: 90 capsule; Refill: 1  2. Hyperlipidemia, unspecified hyperlipidemia type Low fat diet  3. Hypothyroidism, unspecified type  4. Recurrent major depressive disorder, remission status unspecified ([facility]) Stress management  5. Insomnia, unspecified type Bedtime routine  6. Thyroid nodule, left Continue to watch  7. Chronic midline low back pain without sciatica Back stretches - gabapentin (NEURONTIN) 400 MG capsule; Take 3 capsules (1,200 mg total) by mouth at bedtime.  Dispense: 270 capsule; Refill: 1  8. Restless leg Keep legs warm at night - pramipexole (MIRAPEX) 0.5 MG tablet; Take 1 tablet (0.5 mg total) by mouth 3 (three) times daily.  Dispense: 90 tablet; Refill:  0  9. Vasomotor flushing - estrogen, conjugated,-medroxyprogesterone (PREMPRO) 0.45-1.5 MG tablet; Take 1 tablet by mouth daily.  Dispense: 90 tablet; Refill: 1  10. Peripheral edema elevate legs when sitting - furosemide (LASIX) 20 MG tablet; Take 1 tablet (20 mg total) by mouth daily.  Dispense: 30 tablet; Refill: 3   Labs pending Health Maintenance reviewed Diet and exercise encouraged  Follow up plan: 6 months   Mary-Margaret Gaynelle Cage, FNP

## 2019-07-10 LAB — CBC WITH DIFFERENTIAL/PLATELET
Basophils Absolute: 0.1 10*3/uL (ref 0.0–0.2)
Basos: 1 %
EOS (ABSOLUTE): 0.1 10*3/uL (ref 0.0–0.4)
Eos: 2 %
Hematocrit: 39.2 % (ref 34.0–46.6)
Hemoglobin: 13.1 g/dL (ref 11.1–15.9)
Immature Grans (Abs): 0 10*3/uL (ref 0.0–0.1)
Immature Granulocytes: 0 %
Lymphocytes Absolute: 2 10*3/uL (ref 0.7–3.1)
Lymphs: 27 %
MCH: 31.8 pg (ref 26.6–33.0)
MCHC: 33.4 g/dL (ref 31.5–35.7)
MCV: 95 fL (ref 79–97)
Monocytes Absolute: 0.4 10*3/uL (ref 0.1–0.9)
Monocytes: 5 %
Neutrophils Absolute: 4.8 10*3/uL (ref 1.4–7.0)
Neutrophils: 65 %
Platelets: 364 10*3/uL (ref 150–450)
RBC: 4.12 x10E6/uL (ref 3.77–5.28)
RDW: 12.7 % (ref 11.7–15.4)
WBC: 7.4 10*3/uL (ref 3.4–10.8)

## 2019-07-10 LAB — CMP14+EGFR
ALT: 22 IU/L (ref 0–32)
AST: 20 IU/L (ref 0–40)
Albumin/Globulin Ratio: 2.1 (ref 1.2–2.2)
Albumin: 4.4 g/dL (ref 3.8–4.8)
Alkaline Phosphatase: 97 IU/L (ref 39–117)
BUN/Creatinine Ratio: 18 (ref 9–23)
BUN: 12 mg/dL (ref 6–24)
Bilirubin Total: <0.2 mg/dL (ref 0.0–1.2)
CO2: 23 mmol/L (ref 20–29)
Calcium: 9.5 mg/dL (ref 8.7–10.2)
Chloride: 106 mmol/L (ref 96–106)
Creatinine, Ser: 0.67 mg/dL (ref 0.57–1.00)
GFR calc Af Amer: 119 mL/min/{1.73_m2} (ref >59)
GFR calc non Af Amer: 103 mL/min/{1.73_m2} (ref >59)
Globulin, Total: 2.1 g/dL (ref 1.5–4.5)
Glucose: 94 mg/dL (ref 65–99)
Potassium: 4.2 mmol/L (ref 3.5–5.2)
Sodium: 143 mmol/L (ref 134–144)
Total Protein: 6.5 g/dL (ref 6.0–8.5)

## 2019-07-10 LAB — LIPID PANEL
Chol/HDL Ratio: 3 ratio (ref 0.0–4.4)
Cholesterol, Total: 230 mg/dL — ABNORMAL HIGH (ref 100–199)
HDL: 77 mg/dL (ref >39)
LDL Chol Calc (NIH): 141 mg/dL — ABNORMAL HIGH (ref 0–99)
Triglycerides: 71 mg/dL (ref 0–149)
VLDL Cholesterol Cal: 12 mg/dL (ref 5–40)

## 2019-07-10 LAB — THYROID PANEL WITH TSH
Free Thyroxine Index: 1.6 (ref 1.2–4.9)
T3 Uptake Ratio: 23 % — ABNORMAL LOW (ref 24–39)
T4, Total: 7.1 ug/dL (ref 4.5–12.0)
TSH: 0.533 u[IU]/mL (ref 0.450–4.500)

## 2019-07-23 ENCOUNTER — Other Ambulatory Visit: Payer: Self-pay | Admitting: *Deleted

## 2019-07-23 DIAGNOSIS — G2581 Restless legs syndrome: Secondary | ICD-10-CM

## 2019-07-23 MED ORDER — PRAMIPEXOLE DIHYDROCHLORIDE 0.5 MG PO TABS: 0.5000 mg | ORAL_TABLET | Freq: Three times a day (TID) | ORAL | 1 refills | Status: DC

## 2019-07-28 ENCOUNTER — Other Ambulatory Visit: Payer: Self-pay | Admitting: Nurse Practitioner

## 2019-08-15 ENCOUNTER — Other Ambulatory Visit: Payer: Self-pay | Admitting: Nurse Practitioner

## 2019-08-15 DIAGNOSIS — G2581 Restless legs syndrome: Secondary | ICD-10-CM

## 2019-09-02 ENCOUNTER — Other Ambulatory Visit: Payer: Self-pay | Admitting: Nurse Practitioner

## 2019-09-02 DIAGNOSIS — G2581 Restless legs syndrome: Secondary | ICD-10-CM

## 2019-09-15 ENCOUNTER — Telehealth: Payer: Self-pay | Admitting: *Deleted

## 2019-09-15 DIAGNOSIS — R609 Edema, unspecified: Secondary | ICD-10-CM

## 2019-09-15 MED ORDER — FUROSEMIDE 20 MG PO TABS: 20.0000 mg | ORAL_TABLET | Freq: Every day | ORAL | 0 refills | Status: DC

## 2019-09-15 NOTE — Telephone Encounter (Signed)
FUROSEMIDE 20 QD, 90 DAYS GIVEN

## 2019-09-16 ENCOUNTER — Other Ambulatory Visit: Payer: Self-pay | Admitting: Nurse Practitioner

## 2019-09-16 DIAGNOSIS — G2581 Restless legs syndrome: Secondary | ICD-10-CM

## 2019-10-14 ENCOUNTER — Other Ambulatory Visit: Payer: Self-pay | Admitting: Family

## 2019-10-14 ENCOUNTER — Encounter: Payer: Self-pay | Admitting: Nurse Practitioner

## 2019-10-14 MED ORDER — CONJ ESTROG-MEDROXYPROGEST ACE 0.3-1.5 MG PO TABS: 1.0000 | ORAL_TABLET | Freq: Every day | ORAL | 1 refills | Status: DC

## 2019-10-15 ENCOUNTER — Other Ambulatory Visit: Payer: Self-pay | Admitting: Nurse Practitioner

## 2019-12-11 ENCOUNTER — Other Ambulatory Visit: Payer: Self-pay | Admitting: Nurse Practitioner

## 2019-12-11 DIAGNOSIS — R609 Edema, unspecified: Secondary | ICD-10-CM

## 2019-12-16 ENCOUNTER — Other Ambulatory Visit: Payer: Self-pay | Admitting: Nurse Practitioner

## 2020-01-11 ENCOUNTER — Other Ambulatory Visit: Payer: Self-pay | Admitting: Nurse Practitioner

## 2020-01-11 DIAGNOSIS — I10 Essential (primary) hypertension: Secondary | ICD-10-CM

## 2020-01-17 ENCOUNTER — Ambulatory Visit: Payer: 59 | Admitting: Nurse Practitioner

## 2020-01-17 ENCOUNTER — Encounter: Payer: Self-pay | Admitting: Nurse Practitioner

## 2020-01-17 ENCOUNTER — Other Ambulatory Visit: Payer: Self-pay

## 2020-01-17 VITALS — BP 107/66 | HR 74 | Temp 98.0°F | Resp 20 | Ht 70.0 in | Wt 212.0 lb

## 2020-01-17 DIAGNOSIS — E039 Hypothyroidism, unspecified: Secondary | ICD-10-CM

## 2020-01-17 DIAGNOSIS — E782 Mixed hyperlipidemia: Secondary | ICD-10-CM

## 2020-01-17 DIAGNOSIS — F339 Major depressive disorder, recurrent, unspecified: Secondary | ICD-10-CM

## 2020-01-17 DIAGNOSIS — F5101 Primary insomnia: Secondary | ICD-10-CM

## 2020-01-17 DIAGNOSIS — I10 Essential (primary) hypertension: Secondary | ICD-10-CM

## 2020-01-17 DIAGNOSIS — M25552 Pain in left hip: Secondary | ICD-10-CM

## 2020-01-17 DIAGNOSIS — M25551 Pain in right hip: Secondary | ICD-10-CM

## 2020-01-17 DIAGNOSIS — R609 Edema, unspecified: Secondary | ICD-10-CM

## 2020-01-17 DIAGNOSIS — G2581 Restless legs syndrome: Secondary | ICD-10-CM | POA: Diagnosis not present

## 2020-01-17 DIAGNOSIS — G8929 Other chronic pain: Secondary | ICD-10-CM

## 2020-01-17 DIAGNOSIS — M545 Low back pain, unspecified: Secondary | ICD-10-CM

## 2020-01-17 MED ORDER — AMLODIPINE BESY-BENAZEPRIL HCL 10-20 MG PO CAPS: 1.0000 | ORAL_CAPSULE | Freq: Every day | ORAL | 1 refills | Status: DC

## 2020-01-17 MED ORDER — GABAPENTIN 400 MG PO CAPS: 1200.0000 mg | ORAL_CAPSULE | Freq: Every day | ORAL | 1 refills | Status: DC

## 2020-01-17 MED ORDER — PRAMIPEXOLE DIHYDROCHLORIDE 0.5 MG PO TABS: 0.5000 mg | ORAL_TABLET | Freq: Three times a day (TID) | ORAL | 1 refills | Status: DC

## 2020-01-17 MED ORDER — FUROSEMIDE 20 MG PO TABS: 20.0000 mg | ORAL_TABLET | Freq: Every day | ORAL | 1 refills | Status: DC

## 2020-01-17 NOTE — Progress Notes (Signed)
**Note Maria-Identified via Obfuscation** Subjective:    Patient ID: Maria [name], female    DOB: Mar 18, 1971, 49 y.o.   MRN: [id number]   Chief Complaint: Medical Management of Chronic Issues    HPI:  1. Essential hypertension No c/o chet pain, sob or headache. Does  check blood pressure at home. Running in the 572'I systolic. BP Readings from Last 3 Encounters:  01/17/20 107/66  07/09/19 115/67  01/07/19 123/87     2. Mixed hyperlipidemia Does watch diet but does little to no exercise. Lab Results  Component Value Date   CHOL 230 (H) 07/09/2019   HDL 77 07/09/2019   LDLCALC 141 (H) 07/09/2019   TRIG 71 07/09/2019   CHOLHDL 3.0 07/09/2019     3. Acquired hypothyroidism No problme sthat she is aware of. Lab Results  Component Value Date   TSH 0.533 07/09/2019     4. Restless leg Is on mirapex at  Night which helps her legs stay calm. Usually starts with symptoms every evening  5. Primary insomnia Has been sleeping well. Sleeps about 5-6 hours a night.  6. Recurrent major depressive disorder, remission status unspecified (Maria [name]) Doing well without any medication. Depression screen Maria [name] 2/9 01/17/2020 07/09/2019 01/07/2019  Decreased Interest 0 0 0  Down, Depressed, Hopeless 0 0 0  PHQ - 2 Score 0 0 0  Altered sleeping 0 - -  Tired, decreased energy 0 - -  Change in appetite 0 - -  Feeling bad or failure about yourself  0 - -  Trouble concentrating 0 - -  Moving slowly or fidgety/restless 0 - -  Suicidal thoughts 0 - -  PHQ-9 Score 0 - -  Difficult doing work/chores Not difficult at all - -     7. Chronic midline low back pain without sciatica Has chronic back pain. Is no longe ron pain medication. She is only on neurontin daily and helps.    Outpatient Encounter Medications as of 01/17/2020  Medication Sig  . amLODipine-benazepril (LOTREL) 10-20 MG capsule TAKE 1 CAPSULE BY MOUTH EVERY DAY  . estrogen, conjugated,-medroxyprogesterone (PREMPRO) 0.3-1.5 MG tablet Take 1 tablet by mouth daily.   . furosemide (LASIX) 20 MG tablet TAKE 1 TABLET BY MOUTH EVERY DAY  . gabapentin (NEURONTIN) 400 MG capsule Take 3 capsules (1,200 mg total) by mouth at bedtime.  . pramipexole (MIRAPEX) 0.5 MG tablet TAKE 1 TABLET (0.5 MG TOTAL) BY MOUTH 3 (THREE) TIMES DAILY.  Marland Kitchen tiZANidine (ZANAFLEX) 4 MG tablet TAKE 1.5 TABLETS (6 MG TOTAL) BY MOUTH 3 (THREE) TIMES DAILY.   No facility-administered encounter medications on file as of 01/17/2020.    Past Surgical History:  Procedure Laterality Date  . BACK SURGERY  2018  . BREAST SURGERY     lumpectomy  . CESAREAN SECTION    . CHOLECYSTECTOMY    . ENDOMETRIAL ABLATION    . l5-s1 fusion  2008, 2009, 2011  . THYROIDECTOMY Right 2001    Family History  Problem Relation Age of Onset  . Hypertension Mother   . Cancer Mother        breast  . Hypothyroidism Mother   . Hypertension Father   . Hypothyroidism Father     New complaints: BIl hip pain  Social history: Lives with her husband and her autistic son.  Controlled substance contract: n/a    Review of Systems  Constitutional: Negative for diaphoresis.  Eyes: Negative for pain.  Respiratory: Negative for shortness of breath.   Cardiovascular: Negative for chest pain, palpitations  and leg swelling.  Gastrointestinal: Negative for abdominal pain.  Endocrine: Negative for polydipsia.  Skin: Negative for rash.  Neurological: Negative for dizziness, weakness and headaches.  Hematological: Does not bruise/bleed easily.  All other systems reviewed and are negative.      Objective:   Physical Exam Vitals and nursing note reviewed.  Constitutional:      General: She is not in acute distress.    Appearance: Normal appearance. She is well-developed.  HENT:     Head: Normocephalic.     Nose: Nose normal.  Eyes:     Pupils: Pupils are equal, round, and reactive to light.  Neck:     Vascular: No carotid bruit or JVD.  Cardiovascular:     Rate and Rhythm: Normal rate and regular  rhythm.     Heart sounds: Normal heart sounds.  Pulmonary:     Effort: Pulmonary effort is normal. No respiratory distress.     Breath sounds: Normal breath sounds. No wheezing or rales.  Chest:     Chest wall: No tenderness.  Abdominal:     General: Bowel sounds are normal. There is no distension or abdominal bruit.     Palpations: Abdomen is soft. There is no hepatomegaly, splenomegaly, mass or pulsatile mass.     Tenderness: There is no abdominal tenderness.  Musculoskeletal:        General: Normal range of motion.     Cervical back: Normal range of motion and neck supple.  Lymphadenopathy:     Cervical: No cervical adenopathy.  Skin:    General: Skin is warm and dry.  Neurological:     Mental Status: She is alert and oriented to person, place, and time.     Deep Tendon Reflexes: Reflexes are normal and symmetric.  Psychiatric:        Behavior: Behavior normal.        Thought Content: Thought content normal.        Judgment: Judgment normal.    BP 107/66   Pulse 74   Temp 98 F (36.7 C) (Temporal)   Resp 20   Ht _0  (1.778 m)   Wt 212 lb (96.2 kg)   SpO2 96%   BMI 30.42 kg/m         Assessment & Plan:  Maria [name] comes in today with chief complaint of Medical Management of Chronic Issues   Diagnosis and orders addressed:  1. Essential hypertension Low sodium diet - amLODipine-benazepril (LOTREL) 10-20 MG capsule; Take 1 capsule by mouth daily.  Dispense: 90 capsule; Refill: 1 - CBC with Differential/Platelet - CMP14+EGFR  2. Mixed hyperlipidemia Low fat diet - Lipid panel  3. Acquired hypothyroidism - Thyroid Panel With TSH  4. Restless leg Keep legs warm at night  - pramipexole (MIRAPEX) 0.5 MG tablet; Take 1 tablet (0.5 mg total) by mouth 3 (three) times daily.  Dispense: 270 tablet; Refill: 1  5. Primary insomnia Bedtime routine  6. Recurrent major depressive disorder, remission status unspecified (Maria [name]) Stress management  7. Chronic  midline low back pain without sciatica - gabapentin (NEURONTIN) 400 MG capsule; Take 3 capsules (1,200 mg total) by mouth at bedtime.  Dispense: 270 capsule; Refill: 1  8. Hip pain, bilateral  - Ambulatory referral to Orthopedic Surgery  9. Peripheral edema Elevate legs when sitting Compression socks. - furosemide (LASIX) 20 MG tablet; Take 1 tablet (20 mg total) by mouth daily.  Dispense: 90 tablet; Refill: 1   Labs pending Health Maintenance reviewed  Diet and exercise encouraged  Follow up plan: 6 months   Mary-Margaret Hassell Done, FNP

## 2020-01-17 NOTE — Patient Instructions (Signed)
Hip Pain The hip is the joint between the upper legs and the lower pelvis. The bones, cartilage, tendons, and muscles of your hip joint support your body and allow you to move around. Hip pain can range from a minor ache to severe pain in one or both of your hips. The pain may be felt on the inside of the hip joint near the groin, or on the outside near the buttocks and upper thigh. You may also have swelling or stiffness in your hip area. Follow these instructions at home: Managing pain, stiffness, and swelling      If directed, put ice on the painful area. To do this: ? Put ice in a plastic bag. ? Place a towel between your skin and the bag. ? Leave the ice on for 20 minutes, 2-3 times a day.  If directed, apply heat to the affected area as often as told by your health care provider. Use the heat source that your health care provider recommends, such as a moist heat pack or a heating pad. ? Place a towel between your skin and the heat source. ? Leave the heat on for 20-30 minutes. ? Remove the heat if your skin turns bright red. This is especially important if you are unable to feel pain, heat, or cold. You may have a greater risk of getting burned. Activity  Do exercises as told by your health care provider.  Avoid activities that cause pain. General instructions   Take over-the-counter and prescription medicines only as told by your health care provider.  Keep a journal of your symptoms. Write down: ? How often you have hip pain. ? The location of your pain. ? What the pain feels like. ? What makes the pain worse.  Sleep with a pillow between your legs on your most comfortable side.  Keep all follow-up visits as told by your health care provider. This is important. Contact a health care provider if:  You cannot put weight on your leg.  Your pain or swelling continues or gets worse after one week.  It gets harder to walk.  You have a fever. Get help right away  if:  You fall.  You have a sudden increase in pain and swelling in your hip.  Your hip is red or swollen or very tender to touch. Summary  Hip pain can range from a minor ache to severe pain in one or both of your hips.  The pain may be felt on the inside of the hip joint near the groin, or on the outside near the buttocks and upper thigh.  Avoid activities that cause pain.  Write down how often you have hip pain, the location of the pain, what makes it worse, and what it feels like. This information is not intended to replace advice given to you by your health care provider. Make sure you discuss any questions you have with your health care provider. Document Revised: 08/03/2018 Document Reviewed: 08/03/2018 Elsevier Patient Education  2020 [employer] -- 

## 2020-01-18 LAB — CMP14+EGFR
ALT: 17 IU/L (ref 0–32)
AST: 23 IU/L (ref 0–40)
Albumin/Globulin Ratio: 1.9 (ref 1.2–2.2)
Albumin: 4.2 g/dL (ref 3.8–4.8)
Alkaline Phosphatase: 92 IU/L (ref 44–121)
BUN/Creatinine Ratio: 15 (ref 9–23)
BUN: 11 mg/dL (ref 6–24)
Bilirubin Total: <0.2 mg/dL (ref 0.0–1.2)
CO2: 25 mmol/L (ref 20–29)
Calcium: 9.3 mg/dL (ref 8.7–10.2)
Chloride: 103 mmol/L (ref 96–106)
Creatinine, Ser: 0.75 mg/dL (ref 0.57–1.00)
GFR calc Af Amer: 107 mL/min/{1.73_m2} (ref >59)
GFR calc non Af Amer: 93 mL/min/{1.73_m2} (ref >59)
Globulin, Total: 2.2 g/dL (ref 1.5–4.5)
Glucose: 96 mg/dL (ref 65–99)
Potassium: 3.8 mmol/L (ref 3.5–5.2)
Sodium: 141 mmol/L (ref 134–144)
Total Protein: 6.4 g/dL (ref 6.0–8.5)

## 2020-01-18 LAB — CBC WITH DIFFERENTIAL/PLATELET
Basophils Absolute: 0.1 10*3/uL (ref 0.0–0.2)
Basos: 1 %
EOS (ABSOLUTE): 0.2 10*3/uL (ref 0.0–0.4)
Eos: 3 %
Hematocrit: 37.6 % (ref 34.0–46.6)
Hemoglobin: 12.5 g/dL (ref 11.1–15.9)
Immature Grans (Abs): 0 10*3/uL (ref 0.0–0.1)
Immature Granulocytes: 0 %
Lymphocytes Absolute: 1.6 10*3/uL (ref 0.7–3.1)
Lymphs: 19 %
MCH: 31.1 pg (ref 26.6–33.0)
MCHC: 33.2 g/dL (ref 31.5–35.7)
MCV: 94 fL (ref 79–97)
Monocytes Absolute: 0.5 10*3/uL (ref 0.1–0.9)
Monocytes: 6 %
Neutrophils Absolute: 6.1 10*3/uL (ref 1.4–7.0)
Neutrophils: 71 %
Platelets: 349 10*3/uL (ref 150–450)
RBC: 4.02 x10E6/uL (ref 3.77–5.28)
RDW: 12.2 % (ref 11.7–15.4)
WBC: 8.5 10*3/uL (ref 3.4–10.8)

## 2020-01-18 LAB — THYROID PANEL WITH TSH
Free Thyroxine Index: 1.3 (ref 1.2–4.9)
T3 Uptake Ratio: 22 % — ABNORMAL LOW (ref 24–39)
T4, Total: 6.1 ug/dL (ref 4.5–12.0)
TSH: 0.697 u[IU]/mL (ref 0.450–4.500)

## 2020-01-18 LAB — LIPID PANEL
Chol/HDL Ratio: 3.4 ratio (ref 0.0–4.4)
Cholesterol, Total: 210 mg/dL — ABNORMAL HIGH (ref 100–199)
HDL: 61 mg/dL (ref >39)
LDL Chol Calc (NIH): 119 mg/dL — ABNORMAL HIGH (ref 0–99)
Triglycerides: 175 mg/dL — ABNORMAL HIGH (ref 0–149)
VLDL Cholesterol Cal: 30 mg/dL (ref 5–40)

## 2020-01-31 ENCOUNTER — Encounter: Payer: Self-pay | Admitting: Nurse Practitioner

## 2020-02-23 ENCOUNTER — Other Ambulatory Visit: Payer: Self-pay | Admitting: Nurse Practitioner

## 2020-04-04 ENCOUNTER — Other Ambulatory Visit: Payer: Self-pay | Admitting: Family

## 2020-04-17 ENCOUNTER — Other Ambulatory Visit: Payer: Self-pay | Admitting: Nurse Practitioner

## 2020-04-17 MED ORDER — PREDNISONE 10 MG (21) PO TBPK: ORAL_TABLET | ORAL | 0 refills | Status: DC

## 2020-04-25 ENCOUNTER — Other Ambulatory Visit (HOSPITAL_COMMUNITY): Payer: Self-pay | Admitting: Nurse Practitioner

## 2020-04-25 DIAGNOSIS — Z1231 Encounter for screening mammogram for malignant neoplasm of breast: Secondary | ICD-10-CM

## 2020-05-02 ENCOUNTER — Ambulatory Visit: Payer: 59 | Admitting: Nurse Practitioner

## 2020-05-02 ENCOUNTER — Encounter: Payer: Self-pay | Admitting: Nurse Practitioner

## 2020-05-02 ENCOUNTER — Other Ambulatory Visit: Payer: Self-pay

## 2020-05-02 VITALS — BP 126/78 | HR 78 | Temp 98.1°F | Resp 20 | Ht 70.0 in | Wt 208.0 lb

## 2020-05-02 DIAGNOSIS — R059 Cough, unspecified: Secondary | ICD-10-CM

## 2020-05-02 DIAGNOSIS — M546 Pain in thoracic spine: Secondary | ICD-10-CM

## 2020-05-02 NOTE — Progress Notes (Signed)
   Subjective:    Patient ID: Maria [name], female    DOB: 29-Mar-1971, 50 y.o.   MRN: [id number]   Chief Complaint: Back Pain (Upper/)   HPI Patient comes in today c/o pain in mid upper back. Was a piercing pain when she coughed. Started getting some better yesterday. Motrin and tylenol help some. Cough is only minor now.   Review of Systems  Constitutional: Negative for activity change, fatigue and fever.  HENT: Negative for congestion, rhinorrhea, sinus pressure, sinus pain and voice change.   Respiratory: Positive for cough (slight smokers cough).   Cardiovascular: Negative for chest pain and leg swelling.  Musculoskeletal: Positive for back pain (thoracic area.).  Neurological: Negative.   All other systems reviewed and are negative.      Objective:   Physical Exam Vitals and nursing note reviewed.  Constitutional:      Appearance: Normal appearance.  Cardiovascular:     Rate and Rhythm: Normal rate and regular rhythm.     Heart sounds: Normal heart sounds.  Pulmonary:     Effort: Pulmonary effort is normal.     Breath sounds: Normal breath sounds. No wheezing, rhonchi or rales.  Abdominal:     General: Abdomen is flat.     Palpations: Abdomen is soft.  Musculoskeletal:     Comments: Mild point tenderness left id scapula  Skin:    General: Skin is warm and dry.  Neurological:     General: No focal deficit present.     Mental Status: She is alert and oriented to person, place, and time.  Psychiatric:        Mood and Affect: Mood normal.        Behavior: Behavior normal.    BP 126/78   Pulse 78   Temp 98.1 F (36.7 C) (Temporal)   Resp 20   Ht 5\' 10"  (1.778 m)   Wt 208 lb (94.3 kg)   SpO2 95%   BMI 29.84 kg/m         Assessment & Plan:  in today with chief complaint of Back Pain (Upper/)   1. Cough OTC cough meds as needed Force fluids Stop smoking  2. Acute midline thoracic back pain continue motrin and tylenol as  needed    The above assessment and management plan was discussed with the patient. The patient verbalized understanding of and has agreed to the management plan. Patient is aware to call the clinic if symptoms persist or worsen. Patient is aware when to return to the clinic for a follow-up visit. Patient educated on when it is appropriate to go to the emergency department.   Maria Maria Cage, FNP

## 2020-05-02 NOTE — Patient Instructions (Signed)

## 2020-05-12 ENCOUNTER — Other Ambulatory Visit: Payer: Self-pay | Admitting: Nurse Practitioner

## 2020-06-14 ENCOUNTER — Ambulatory Visit (HOSPITAL_COMMUNITY)
Admission: RE | Admit: 2020-06-14 | Discharge: 2020-06-14 | Disposition: A | Discharge status: Home / Self Care | Payer: 59 | Source: Ambulatory Visit | Attending: Nurse Practitioner | Admitting: Nurse Practitioner

## 2020-06-14 DIAGNOSIS — Z1231 Encounter for screening mammogram for malignant neoplasm of breast: Secondary | ICD-10-CM | POA: Diagnosis present

## 2020-07-10 ENCOUNTER — Other Ambulatory Visit: Payer: Self-pay | Admitting: Nurse Practitioner

## 2020-07-18 ENCOUNTER — Ambulatory Visit: Payer: 59 | Admitting: Nurse Practitioner

## 2020-07-18 ENCOUNTER — Other Ambulatory Visit: Payer: Self-pay

## 2020-07-18 ENCOUNTER — Encounter: Payer: Self-pay | Admitting: Nurse Practitioner

## 2020-07-18 VITALS — BP 136/85 | HR 80 | Temp 98.7°F | Resp 20 | Ht 70.0 in | Wt 206.0 lb

## 2020-07-18 DIAGNOSIS — E782 Mixed hyperlipidemia: Secondary | ICD-10-CM | POA: Diagnosis not present

## 2020-07-18 DIAGNOSIS — M545 Low back pain, unspecified: Secondary | ICD-10-CM

## 2020-07-18 DIAGNOSIS — E039 Hypothyroidism, unspecified: Secondary | ICD-10-CM | POA: Diagnosis not present

## 2020-07-18 DIAGNOSIS — R600 Localized edema: Secondary | ICD-10-CM

## 2020-07-18 DIAGNOSIS — F339 Major depressive disorder, recurrent, unspecified: Secondary | ICD-10-CM

## 2020-07-18 DIAGNOSIS — F5101 Primary insomnia: Secondary | ICD-10-CM

## 2020-07-18 DIAGNOSIS — G2581 Restless legs syndrome: Secondary | ICD-10-CM

## 2020-07-18 DIAGNOSIS — I10 Essential (primary) hypertension: Secondary | ICD-10-CM | POA: Diagnosis not present

## 2020-07-18 DIAGNOSIS — E041 Nontoxic single thyroid nodule: Secondary | ICD-10-CM | POA: Diagnosis not present

## 2020-07-18 DIAGNOSIS — G8929 Other chronic pain: Secondary | ICD-10-CM | POA: Diagnosis not present

## 2020-07-18 DIAGNOSIS — R609 Edema, unspecified: Secondary | ICD-10-CM

## 2020-07-18 MED ORDER — PREDNISONE 10 MG (21) PO TBPK: ORAL_TABLET | ORAL | 0 refills | Status: DC

## 2020-07-18 MED ORDER — PRAMIPEXOLE DIHYDROCHLORIDE 0.5 MG PO TABS
0.5000 mg | ORAL_TABLET | Freq: Three times a day (TID) | ORAL | 1 refills | Status: DC
Start: 2020-07-18 — End: 2020-12-11

## 2020-07-18 MED ORDER — AMLODIPINE BESY-BENAZEPRIL HCL 10-20 MG PO CAPS: 1.0000 | ORAL_CAPSULE | Freq: Every day | ORAL | 1 refills | Status: DC

## 2020-07-18 MED ORDER — FUROSEMIDE 20 MG PO TABS: 20.0000 mg | ORAL_TABLET | Freq: Every day | ORAL | 1 refills | Status: DC

## 2020-07-18 MED ORDER — TIZANIDINE HCL 4 MG PO TABS
6.0000 mg | ORAL_TABLET | Freq: Three times a day (TID) | ORAL | 0 refills | Status: DC
Start: 2020-07-18 — End: 2020-09-07

## 2020-07-18 MED ORDER — GABAPENTIN 400 MG PO CAPS: 1200.0000 mg | ORAL_CAPSULE | Freq: Every day | ORAL | 1 refills | Status: DC

## 2020-07-18 MED ORDER — KETOROLAC TROMETHAMINE 60 MG/2ML IM SOLN
60.0000 mg | Freq: Once | INTRAMUSCULAR | Status: AC
  Administered 2020-07-18: 60 mg via INTRAMUSCULAR

## 2020-07-18 NOTE — Addendum Note (Signed)
Addended by: Cleda Daub on: 07/18/2020 11:31 AM   Modules accepted: Orders

## 2020-07-18 NOTE — Progress Notes (Signed)
Subjective:    Patient ID: Maria [name], female    DOB: 11/04/1970, 50 y.o.   MRN: [id number]   Chief Complaint: Medical Management of Chronic Issues    HPI:  1. Essential hypertension No c/o chest pain, sob or headache. Does not check blood pressure at home. BP Readings from Last 3 Encounters:  07/18/20 136/85  05/02/20 126/78  01/17/20 107/66     2. Mixed hyperlipidemia *does try to watch diet. Has not been exercising much lately Lab Results  Component Value Date   CHOL 210 (H) 01/17/2020   HDL 61 01/17/2020   LDLCALC 119 (H) 01/17/2020   TRIG 175 (H) 01/17/2020   CHOLHDL 3.4 01/17/2020    3. Acquired hypothyroidism No problems that she is aware of. Lab Results  Component Value Date   TSH 0.697 01/17/2020     4. Thyroid nodule, left No changes  5. Recurrent major depressive disorder, remission status unspecified (HCC) currenlty doing well and is currently on no meds. Depression screen [facility] 2/9 07/18/2020 05/02/2020 01/17/2020  Decreased Interest 0 0 0  Down, Depressed, Hopeless 1 0 0  PHQ - 2 Score 1 0 0  Altered sleeping 0 - 0  Tired, decreased energy 0 - 0  Change in appetite 0 - 0  Feeling bad or failure about yourself  0 - 0  Trouble concentrating 0 - 0  Moving slowly or fidgety/restless 0 - 0  Suicidal thoughts 0 - 0  PHQ-9 Score 1 - 0  Difficult doing work/chores Not difficult at all - Not difficult at all     6. Primary insomnia Only sleeps 3-4 hours a night. She has always been that way.  7. Restless leg Is on mirapex and that usually helps  8. Chronic midline low back pain without sciatica Pain has started in lower back pain left side. Her pain is usuallly on right side. Sh ehas been on motrin and tylenol only for pain since her back surgery in 2018. Rate sher left back pain 2/10 currently, but if she tries to get up and walk rates 6/10. Motrin is not helping.    Outpatient Encounter Medications as of 07/18/2020  Medication Sig  .  amLODipine-benazepril (LOTREL) 10-20 MG capsule Take 1 capsule by mouth daily.  . furosemide (LASIX) 20 MG tablet Take 1 tablet (20 mg total) by mouth daily.  Marland Kitchen gabapentin (NEURONTIN) 400 MG capsule Take 3 capsules (1,200 mg total) by mouth at bedtime.  . Multiple Vitamin (MULTIVITAMIN) tablet Take 1 tablet by mouth daily.  . pramipexole (MIRAPEX) 0.5 MG tablet Take 1 tablet (0.5 mg total) by mouth 3 (three) times daily.  Marland Kitchen PREMPRO 0.3-1.5 MG tablet TAKE 1 TABLET BY MOUTH EVERY DAY  . tiZANidine (ZANAFLEX) 4 MG tablet TAKE 1.5 TABLETS (6 MG TOTAL) BY MOUTH 3 (THREE) TIMES DAILY.   No facility-administered encounter medications on file as of 07/18/2020.    Past Surgical History:  Procedure Laterality Date  . BACK SURGERY  2018  . BREAST SURGERY     lumpectomy  . CESAREAN SECTION    . CHOLECYSTECTOMY    . ENDOMETRIAL ABLATION    . l5-s1 fusion  2008, 2009, 2011  . THYROIDECTOMY Right 2001    Family History  Problem Relation Age of Onset  . Hypertension Mother   . Cancer Mother        breast  . Hypothyroidism Mother   . Hypertension Father   . Hypothyroidism Father     New complaints:  none  Social history: Lives with husband  Controlled substance contract: n/a    Review of Systems  Constitutional: Negative for diaphoresis.  Eyes: Negative for pain.  Respiratory: Negative for shortness of breath.   Cardiovascular: Negative for chest pain, palpitations and leg swelling.  Gastrointestinal: Negative for abdominal pain.  Endocrine: Negative for polydipsia.  Musculoskeletal: Positive for back pain.  Skin: Negative for rash.  Neurological: Negative for dizziness, weakness and headaches.  Hematological: Does not bruise/bleed easily.  All other systems reviewed and are negative.      Objective:   Physical Exam Vitals and nursing note reviewed.  Constitutional:      General: She is not in acute distress.    Appearance: Normal appearance. She is well-developed.  HENT:      Head: Normocephalic.     Nose: Nose normal.  Eyes:     Pupils: Pupils are equal, round, and reactive to light.  Neck:     Vascular: No carotid bruit or JVD.  Cardiovascular:     Rate and Rhythm: Normal rate and regular rhythm.     Heart sounds: Normal heart sounds.  Pulmonary:     Effort: Pulmonary effort is normal. No respiratory distress.     Breath sounds: Normal breath sounds. No wheezing or rales.  Chest:     Chest wall: No tenderness.  Abdominal:     General: Bowel sounds are normal. There is no distension or abdominal bruit.     Palpations: Abdomen is soft. There is no hepatomegaly, splenomegaly, mass or pulsatile mass.     Tenderness: There is no abdominal tenderness.  Musculoskeletal:        General: Normal range of motion.     Cervical back: Normal range of motion and neck supple.     Comments: Rising slowly form sitting to standiing Walking with cane (-) SLR  Lymphadenopathy:     Cervical: No cervical adenopathy.  Skin:    General: Skin is warm and dry.  Neurological:     Mental Status: She is alert and oriented to person, place, and time.     Deep Tendon Reflexes: Reflexes are normal and symmetric.  Psychiatric:        Behavior: Behavior normal.        Thought Content: Thought content normal.        Judgment: Judgment normal.     BP 136/85   Pulse 80   Temp 98.7 F (37.1 C) (Temporal)   Resp 20   Ht 5\' 10"  (1.778 m)   Wt 206 lb (93.4 kg)   SpO2 95%   BMI 29.56 kg/m        Assessment & Plan:  Maria [name] comes in today with chief complaint of Medical Management of Chronic Issues   Diagnosis and orders addressed:  1. Essential hypertension Low sodium diet - amLODipine-benazepril (LOTREL) 10-20 MG capsule; Take 1 capsule by mouth daily.  Dispense: 90 capsule; Refill: 1  2. Mixed hyperlipidemia Low fat diet  3. Acquired hypothyroidism Labs pending  4. Thyroid nodule, left  5. Recurrent major depressive disorder, remission status  unspecified (HCC) Stress management  6. Primary insomnia Bedtime routine  7. Restless leg Keep legs warm - pramipexole (MIRAPEX) 0.5 MG tablet; Take 1 tablet (0.5 mg total) by mouth 3 (three) times daily.  Dispense: 270 tablet; Refill: 1  8. Chronic midline low back pain without sciatica Moist heat rest - MR Lumbar Spine Wo Contrast; Future - predniSONE (STERAPRED UNI-PAK 21 TAB) 10  MG (21) TBPK tablet; As directed x 6 days  Dispense: 21 tablet; Refill: 0 - ketorolac (TORADOL) injection 60 mg - gabapentin (NEURONTIN) 400 MG capsule; Take 3 capsules (1,200 mg total) by mouth at bedtime.  Dispense: 270 capsule; Refill: 1 - tiZANidine (ZANAFLEX) 4 MG tablet; Take 1.5 tablets (6 mg total) by mouth 3 (three) times daily.  Dispense: 135 tablet; Refill: 0  9. Peripheral edema - furosemide (LASIX) 20 MG tablet; Take 1 tablet (20 mg total) by mouth daily.  Dispense: 90 tablet; Refill: 1   Labs pending Health Maintenance reviewed Diet and exercise encouraged  Follow up plan: 6 months   Mary-Margaret Daphine Deutscher, FNP

## 2020-07-18 NOTE — Patient Instructions (Signed)
Acute Back Pain, Adult Acute back pain is sudden and usually short-lived. It is often caused by an injury to the muscles and tissues in the back. The injury may result from:  A muscle or ligament getting overstretched or torn (strained). Ligaments are tissues that connect bones to each other. Lifting something improperly can cause a back strain.  Wear and tear (degeneration) of the spinal disks. Spinal disks are circular tissue that provide cushioning between the bones of the spine (vertebrae).  Twisting motions, such as while playing sports or doing yard work.  A hit to the back.  Arthritis. You may have a physical exam, lab tests, and imaging tests to find the cause of your pain. Acute back pain usually goes away with rest and home care. Follow these instructions at home: Managing pain, stiffness, and swelling  Treatment may include medicines for pain and inflammation that are taken by mouth or applied to the skin, prescription pain medicine, or muscle relaxants. Take over-the-counter and prescription medicines only as told by your health care provider.  Your health care provider may recommend applying ice during the first 24-48 hours after your pain starts. To do this: ? Put ice in a plastic bag. ? Place a towel between your skin and the bag. ? Leave the ice on for 20 minutes, 2-3 times a day.  If directed, apply heat to the affected area as often as told by your health care provider. Use the heat source that your health care provider recommends, such as a moist heat pack or a heating pad. ? Place a towel between your skin and the heat source. ? Leave the heat on for 20-30 minutes. ? Remove the heat if your skin turns bright red. This is especially important if you are unable to feel pain, heat, or cold. You have a greater risk of getting burned. Activity  Do not stay in bed. Staying in bed for more than 1-2 days can delay your recovery.  Sit up and stand up straight. Avoid leaning  forward when you sit or hunching over when you stand. ? If you work at a desk, sit close to it so you do not need to lean over. Keep your chin tucked in. Keep your neck drawn back, and keep your elbows bent at a 90-degree angle (right angle). ? Sit high and close to the steering wheel when you drive. Add lower back (lumbar) support to your car seat, if needed.  Take short walks on even surfaces as soon as you are able. Try to increase the length of time you walk each day.  Do not sit, drive, or stand in one place for more than 30 minutes at a time. Sitting or standing for long periods of time can put stress on your back.  Do not drive or use heavy machinery while taking prescription pain medicine.  Use proper lifting techniques. When you bend and lift, use positions that put less stress on your back: ? Bend your knees. ? Keep the load close to your body. ? Avoid twisting.  Exercise regularly as told by your health care provider. Exercising helps your back heal faster and helps prevent back injuries by keeping muscles strong and flexible.  Work with a physical therapist to make a safe exercise program, as recommended by your health care provider. Do any exercises as told by your physical therapist.   Lifestyle  Maintain a healthy weight. Extra weight puts stress on your back and makes it difficult to have   good posture.  Avoid activities or situations that make you feel anxious or stressed. Stress and anxiety increase muscle tension and can make back pain worse. Learn ways to manage anxiety and stress, such as through exercise. General instructions  Sleep on a firm mattress in a comfortable position. Try lying on your side with your knees slightly bent. If you lie on your back, put a pillow under your knees.  Follow your treatment plan as told by your health care provider. This may include: ? Cognitive or behavioral therapy. ? Acupuncture or massage therapy. ? Meditation or yoga. Contact  a health care provider if:  You have pain that is not relieved with rest or medicine.  You have increasing pain going down into your legs or buttocks.  Your pain does not improve after 2 weeks.  You have pain at night.  You lose weight without trying.  You have a fever or chills. Get help right away if:  You develop new bowel or bladder control problems.  You have unusual weakness or numbness in your arms or legs.  You develop nausea or vomiting.  You develop abdominal pain.  You feel faint. Summary  Acute back pain is sudden and usually short-lived.  Use proper lifting techniques. When you bend and lift, use positions that put less stress on your back.  Take over-the-counter and prescription medicines and apply heat or ice as directed by your health care provider. This information is not intended to replace advice given to you by your health care provider. Make sure you discuss any questions you have with your health care provider. Document Revised: 12/10/2019 Document Reviewed: 12/10/2019 Elsevier Patient Education  2021 Elsevier Inc.  

## 2020-07-19 LAB — CBC WITH DIFFERENTIAL/PLATELET
Basophils Absolute: 0.1 10*3/uL (ref 0.0–0.2)
Basos: 1 %
EOS (ABSOLUTE): 0.1 10*3/uL (ref 0.0–0.4)
Eos: 2 %
Hematocrit: 39.2 % (ref 34.0–46.6)
Hemoglobin: 13.6 g/dL (ref 11.1–15.9)
Immature Grans (Abs): 0 10*3/uL (ref 0.0–0.1)
Immature Granulocytes: 1 %
Lymphocytes Absolute: 2.4 10*3/uL (ref 0.7–3.1)
Lymphs: 27 %
MCH: 32 pg (ref 26.6–33.0)
MCHC: 34.7 g/dL (ref 31.5–35.7)
MCV: 92 fL (ref 79–97)
Monocytes Absolute: 0.3 10*3/uL (ref 0.1–0.9)
Monocytes: 4 %
Neutrophils Absolute: 5.8 10*3/uL (ref 1.4–7.0)
Neutrophils: 65 %
Platelets: 494 10*3/uL — ABNORMAL HIGH (ref 150–450)
RBC: 4.25 x10E6/uL (ref 3.77–5.28)
RDW: 12.8 % (ref 11.7–15.4)
WBC: 8.7 10*3/uL (ref 3.4–10.8)

## 2020-07-19 LAB — CMP14+EGFR
ALT: 16 IU/L (ref 0–32)
AST: 18 IU/L (ref 0–40)
Albumin/Globulin Ratio: 1.8 (ref 1.2–2.2)
Albumin: 4.5 g/dL (ref 3.8–4.9)
Alkaline Phosphatase: 104 IU/L (ref 44–121)
BUN/Creatinine Ratio: 22 (ref 9–23)
BUN: 14 mg/dL (ref 6–24)
Bilirubin Total: <0.2 mg/dL (ref 0.0–1.2)
CO2: 23 mmol/L (ref 20–29)
Calcium: 9.7 mg/dL (ref 8.7–10.2)
Chloride: 104 mmol/L (ref 96–106)
Creatinine, Ser: 0.63 mg/dL (ref 0.57–1.00)
Globulin, Total: 2.5 g/dL (ref 1.5–4.5)
Glucose: 99 mg/dL (ref 65–99)
Potassium: 3.8 mmol/L (ref 3.5–5.2)
Sodium: 141 mmol/L (ref 134–144)
Total Protein: 7 g/dL (ref 6.0–8.5)
eGFR: 107 mL/min/{1.73_m2} (ref >59)

## 2020-07-19 LAB — LIPID PANEL
Chol/HDL Ratio: 3.5 ratio (ref 0.0–4.4)
Cholesterol, Total: 229 mg/dL — ABNORMAL HIGH (ref 100–199)
HDL: 66 mg/dL (ref >39)
LDL Chol Calc (NIH): 141 mg/dL — ABNORMAL HIGH (ref 0–99)
Triglycerides: 126 mg/dL (ref 0–149)
VLDL Cholesterol Cal: 22 mg/dL (ref 5–40)

## 2020-07-19 LAB — THYROID PANEL WITH TSH
Free Thyroxine Index: 1.6 (ref 1.2–4.9)
T3 Uptake Ratio: 20 % — ABNORMAL LOW (ref 24–39)
T4, Total: 7.9 ug/dL (ref 4.5–12.0)
TSH: 0.486 u[IU]/mL (ref 0.450–4.500)

## 2020-08-03 ENCOUNTER — Ambulatory Visit (HOSPITAL_COMMUNITY)
Admission: RE | Admit: 2020-08-03 | Discharge: 2020-08-03 | Disposition: A | Discharge status: Home / Self Care | Payer: 59 | Source: Ambulatory Visit | Attending: Nurse Practitioner | Admitting: Nurse Practitioner

## 2020-08-03 ENCOUNTER — Other Ambulatory Visit: Payer: Self-pay

## 2020-08-03 DIAGNOSIS — M545 Low back pain, unspecified: Secondary | ICD-10-CM | POA: Diagnosis not present

## 2020-08-03 DIAGNOSIS — G8929 Other chronic pain: Secondary | ICD-10-CM | POA: Insufficient documentation

## 2020-08-04 ENCOUNTER — Other Ambulatory Visit: Payer: Self-pay | Admitting: Nurse Practitioner

## 2020-08-04 DIAGNOSIS — G8929 Other chronic pain: Secondary | ICD-10-CM

## 2020-08-04 DIAGNOSIS — M545 Low back pain, unspecified: Secondary | ICD-10-CM

## 2020-09-05 ENCOUNTER — Encounter (HOSPITAL_COMMUNITY): Payer: Self-pay

## 2020-09-05 ENCOUNTER — Other Ambulatory Visit: Payer: Self-pay

## 2020-09-05 ENCOUNTER — Ambulatory Visit (HOSPITAL_COMMUNITY): Payer: 59 | Attending: Anesthesiology

## 2020-09-05 DIAGNOSIS — R262 Difficulty in walking, not elsewhere classified: Secondary | ICD-10-CM | POA: Diagnosis present

## 2020-09-05 DIAGNOSIS — M545 Low back pain, unspecified: Secondary | ICD-10-CM | POA: Insufficient documentation

## 2020-09-05 DIAGNOSIS — G8929 Other chronic pain: Secondary | ICD-10-CM | POA: Diagnosis present

## 2020-09-05 DIAGNOSIS — M6281 Muscle weakness (generalized): Secondary | ICD-10-CM | POA: Diagnosis present

## 2020-09-05 NOTE — Patient Instructions (Signed)
Access Code: [email or web address] URL: [email or web address] Date: 09/05/2020 Prepared by: Shary Decamp  Exercises Supine Single Knee to Chest Stretch - 1 x daily - 7 x weekly - 3 sets - 5 reps - 30-60 hold Supine Piriformis Stretch with Leg Straight - 1 x daily - 7 x weekly - 3 sets - 5 reps - 30-60 sec hold Supine Posterior Pelvic Tilt - 1 x daily - 7 x weekly - 3 sets - 10 reps - 3 sec hold Seated Piriformis Stretch - 1 x daily - 7 x weekly - 3 sets - 10 reps

## 2020-09-05 NOTE — Therapy (Signed)
[facility] [address] [place], [place], [address] Phone: [phone]   Fax:  [phone]  Physical Therapy Evaluation  Patient Details  Name: Maria [name] MRN: [id number] Date of Birth: Sep 08, 1970 Referring Provider (PT): Thyra Breed MD   Encounter Date: 09/05/2020   PT End of Session - 09/05/20 0858    Visit Number 1    Number of Visits 12    Date for PT Re-Evaluation 10/17/20    Authorization Type [employer], 60 VL    Authorization - Visit Number 1    Authorization - Number of Visits 60    Progress Note Due on Visit 10    PT Start Time 0900    PT Stop Time 0945    PT Time Calculation (min) 45 min    Activity Tolerance Patient tolerated treatment well    Behavior During Therapy [facility] for tasks assessed/performed           Past Medical History:  Diagnosis Date  . Chronic low back pain   . Depression   . Frequent UTI   . Hyperlipidemia   . Hypertension   . Insomnia   . Thyroid disease    hypothyroidism    Past Surgical History:  Procedure Laterality Date  . BACK SURGERY  2018  . BREAST SURGERY     lumpectomy  . CESAREAN SECTION    . CHOLECYSTECTOMY    . ENDOMETRIAL ABLATION    . l5-s1 fusion  2008, 2009, 2011  . THYROIDECTOMY Right 2001    There were no vitals filed for this visit.    Subjective Assessment - 09/05/20 0901    Subjective Patient has hx of 4 lumbar surgeries L4-S1 fusion most recently (2018) with insrumentation/graft due to previous failures. Pt notes RLE neuropathy from lumbar surgeries and has been having left buttock pain since last summer. "Feels like I'm sitting on a grapefruit." Pt notes she was assisting her elderly father before he passed away and she feels this may have aggravated her symptoms and notes increased pain with stair negotiation    Limitations Sitting;Lifting;Standing;Walking    How long can you sit comfortably? sitting 8-9 hours for employment    How long can you  stand comfortably? 5 minutes    How long can you walk comfortably? 5 minutes    Diagnostic tests MRI: reveals L3-L4 disease    Patient Stated Goals "I don't want to be in pain"    Currently in Pain? Yes    Pain Score 5     Pain Location Buttocks    Pain Orientation Left    Pain Descriptors / Indicators Aching;Sore;Tightness    Pain Type Chronic pain    Pain Radiating Towards low back and left posterior thigh and anterior left leg    Pain Onset More than a month ago    Pain Frequency Constant    Aggravating Factors  stairs, walking, left sidelying    Pain Relieving Factors "not much of anything"    Effect of Pain on Daily Activities limits all activities              [facility] PT Assessment - 09/05/20 0001      Assessment   Medical Diagnosis LBP Segmental SIJ Dysfunction with piriformis    Referring Provider (PT) Thyra Breed MD    Next MD Visit 09/21/20    Prior Therapy 2011      Balance Screen   Has the patient fallen in the past 6  months Yes    How many times? 1    Has the patient had a decrease in activity level because of a fear of falling?  Yes    Is the patient reluctant to leave their home because of a fear of falling?  No      Home Nurse, mental health Private residence    Living Arrangements Children;Spouse/significant other    Available Help at Discharge Family    Type of Home House    Home Access Stairs to enter    Entrance Stairs-Number of Steps 3    Home Layout Two level;Able to live on main level with bedroom/bathroom    Home Equipment Cane - single point      Prior Function   Level of Independence Independent    Vocation Full time employment    Immunologist      Observation/Other Assessments   Observations right LE atrophy as compared to LLE, most notably in gastroc    Focus on Therapeutic Outcomes (FOTO)  38% function      ROM / Strength   AROM / PROM / Strength AROM;Strength      AROM   AROM Assessment Site  Hip;Lumbar    Right/Left Hip Right;Left    Right Hip Flexion 120    Left Hip Flexion 110      Strength   Strength Assessment Site Hip;Knee;Ankle    Right/Left Knee Right;Left    Right Knee Flexion 3+/5    Right Knee Extension 3+/5    Left Knee Flexion 4/5    Left Knee Extension 4/5    Right/Left Ankle Right;Left    Right Ankle Dorsiflexion 4-/5    Right Ankle Plantar Flexion 3+/5    Left Ankle Dorsiflexion 5/5    Left Ankle Plantar Flexion 4/5      Flexibility   Soft Tissue Assessment /Muscle Length yes    Hamstrings WNL    Piriformis tight/restricted   left side, incrsased symptoms with stretch     Palpation   Patella mobility left greater trochanter and sciatic notch TTP      Special Tests    Special Tests Sacrolliac Tests    Other special tests unrevealing      Ambulation/Gait   Ambulation/Gait Yes    Ambulation/Gait Assistance 7: Independent    Ambulation Distance (Feet) 250 Feet    Assistive device None    Gait Pattern Antalgic    Ambulation Surface Level;Indoor    Stairs Yes    Stairs Assistance 6: Modified independent (Device/Increase time)    Stair Management Technique Two rails;Step to pattern    Gait Comments                      Objective measurements completed on examination: See above findings.               PT Education - 09/05/20 0955    Education Details education/discussion regarding lumbar/hip anatomy. HEP initiation    Person(s) Educated Patient    Methods Explanation    Comprehension Verbalized understanding            PT Short Term Goals - 09/05/20 1049      PT SHORT TERM GOAL #1   Title Patient will be independent with HEP in order to improve functional outcomes.    Time 3    Period Weeks    Status New    Target Date 09/26/20  PT SHORT TERM GOAL #2   Title Patient will report at least 25% improvement in symptoms for improved quality of life.    Baseline symptoms eminating from left buttocks, low  back, lower leg    Time 3    Period Weeks    Status New    Target Date 09/26/20      PT SHORT TERM GOAL #3   Title Patient will be able to navigate stairs with reciprocal pattern without compensation in order to demonstrate improved LE strength.    Baseline step-to pattern with BHR    Time 3    Period Weeks    Status New    Target Date 09/26/20             PT Long Term Goals - 09/05/20 1052      PT LONG TERM GOAL #1   Title Patient will improve FOTO score by at least 5 points in order to indicate improved tolerance to activity.    Baseline 38% function    Time 6    Period Weeks    Status New    Target Date 10/17/20      PT LONG TERM GOAL #2   Title Patient will be able to ambulate at least 400 feet in in order to demonstrate improved gait speed for community ambulation.    Baseline 250 ft with antalgic pattern    Time 6    Period Weeks    Status New    Target Date 10/17/20      PT LONG TERM GOAL #3   Title Demo proper lifting form with core engagment to improve functional performance and reduce risk for back injury    Time 6    Period Weeks    Status New    Target Date 10/17/20                  Plan - 09/05/20 0956    Clinical Impression Statement Patient is a  50 yo lady presenting to physical therapy with c/o chronic back and RLE and LLE pain. She presents with pain limited deficits in core and BLE strength, ROM, endurance, postural impairments, spinal mobility and functional mobility with ADL. She is having to modify and restrict ADL as indicated by FOTO score as well as subjective information and objective measures which is affecting overall participation. Patient will benefit from skilled physical therapy in order to improve function and reduce impairment.    Personal Factors and Comorbidities Comorbidity 1;Time since onset of injury/illness/exacerbation;Past/Current Experience    Comorbidities surgical hx    Examination-Activity Limitations Bed  Mobility;Bend;Carry;Lift;Stand;Stairs;Squat;Locomotion Level;Transfers    Examination-Participation Restrictions Cleaning;Community Activity;Driving;Laundry;Yard Work;Occupation    Stability/Clinical Decision Making Stable/Uncomplicated    Clinical Decision Making Low    Rehab Potential Good    PT Frequency 2x / week    PT Duration 6 weeks    PT Treatment/Interventions ADLs/Self Care Home Management;Aquatic Therapy;Biofeedback;Cryotherapy;Electrical Stimulation;DME Instruction;Ultrasound;Moist Heat;Gait training;Stair training;Functional mobility training;Therapeutic activities;Therapeutic exercise;Balance training;Patient/family education;Neuromuscular re-education;Manual techniques;Passive range of motion;Taping;Dry needling;Spinal Manipulations;Joint Manipulations    PT Next Visit Plan Continue with core stabilization and LLE stretching/piriformis    PT Home Exercise Plan SKTC, piriformis stretch (supine and sitting), PPT    Consulted and Agree with Plan of Care Patient           Patient will benefit from skilled therapeutic intervention in order to improve the following deficits and impairments:  Abnormal gait,Decreased activity tolerance,Decreased endurance,Decreased range of motion,Decreased strength,Difficulty walking,Increased muscle spasms,Impaired  perceived functional ability,Impaired flexibility,Postural dysfunction,Improper body mechanics,Pain  Visit Diagnosis: Chronic low back pain, unspecified back pain laterality, unspecified whether sciatica present  Muscle weakness (generalized)  Difficulty in walking, not elsewhere classified     Problem List Patient Active Problem List   Diagnosis Date Noted  . Restless leg 07/09/2019  . Essential hypertension 06/02/2014  . Thyroid nodule, left 08/31/2013  . Hypothyroidism 08/10/2012  . Hyperlipidemia 08/10/2012  . Depression 08/10/2012  . Chronic back pain 08/10/2012  . Insomnia 08/10/2012   10:55 AM, 09/05/20 M. Shary DecampKelly  Andreana Klingerman, PT, DPT Physical Therapist- [place] Office Number: [phone]  [facility] [address] [place], [place], [address] Phone: [phone]   Fax:  [phone]  Name: Gaynelle CageKatherine H Steege MRN: [id number] Date of Birth: 03/15/1971

## 2020-09-07 ENCOUNTER — Ambulatory Visit (HOSPITAL_COMMUNITY): Payer: 59

## 2020-09-07 ENCOUNTER — Other Ambulatory Visit: Payer: Self-pay

## 2020-09-07 ENCOUNTER — Other Ambulatory Visit: Payer: Self-pay | Admitting: Nurse Practitioner

## 2020-09-07 ENCOUNTER — Encounter (HOSPITAL_COMMUNITY): Payer: Self-pay

## 2020-09-07 DIAGNOSIS — G8929 Other chronic pain: Secondary | ICD-10-CM

## 2020-09-07 DIAGNOSIS — M545 Low back pain, unspecified: Secondary | ICD-10-CM

## 2020-09-07 DIAGNOSIS — M6281 Muscle weakness (generalized): Secondary | ICD-10-CM

## 2020-09-07 DIAGNOSIS — R262 Difficulty in walking, not elsewhere classified: Secondary | ICD-10-CM

## 2020-09-07 NOTE — Therapy (Signed)
[facility] [address] [place], [place], [address] Phone: [phone]   Fax:  [phone]  Physical Therapy Treatment  Patient Details  Name: Maria [name] MRN: [id number] Date of Birth: [birth year] Referring Provider (PT): Thyra Breed MD   Encounter Date: 09/07/2020   PT End of Session - 09/07/20 0923     Visit Number 2    Number of Visits 12    Date for PT Re-Evaluation 10/17/20    Authorization Type [employer], 60 VL    Authorization - Visit Number 2    Authorization - Number of Visits 60    Progress Note Due on Visit 10    PT Start Time [phone]    PT Stop Time 0958    PT Time Calculation (min) 40 min    Activity Tolerance Patient limited by pain;Patient tolerated treatment well    Behavior During Therapy [facility] for tasks assessed/performed             Past Medical History:  Diagnosis Date   Chronic low back pain    Depression    Frequent UTI    Hyperlipidemia    Hypertension    Insomnia    Thyroid disease    hypothyroidism    Past Surgical History:  Procedure Laterality Date   BACK SURGERY  2018   BREAST SURGERY     lumpectomy   CESAREAN SECTION     CHOLECYSTECTOMY     ENDOMETRIAL ABLATION     l5-s1 fusion  2008, 2009, 2011   THYROIDECTOMY Right 2001    There were no vitals filed for this visit.   Subjective Assessment - 09/07/20 0920     Subjective Pt stated she is sore Lt Lower back than goes to anterior thigh and ends at shin, feels like she is sitting on a grapefruit.    Patient Stated Goals "I don't want to be in pain"    Currently in Pain? Yes    Pain Score 6     Pain Location Back    Pain Orientation Left;Lower    Pain Descriptors / Indicators Sore;Aching;Tightness    Pain Type Chronic pain    Pain Radiating Towards low back and left posterior thigh and anterior left leg    Pain Onset More than a month ago    Pain Frequency Constant    Aggravating Factors  stairs, walking, left  sidelying    Pain Relieving Factors "not much of anything", pain meds, MHP    Effect of Pain on Daily Activities limits all activities                               [facility] Adult PT Treatment/Exercise - 09/07/20 0001       Exercises   Exercises Lumbar      Lumbar Exercises: Stretches   Single Knee to Chest Stretch 3 reps;30 seconds    Piriformis Stretch 3 reps;30 seconds    Piriformis Stretch Limitations seated      Lumbar Exercises: Supine   Ab Set 10 reps;5 seconds    AB Set Limitations paired wiht exhale    Bent Knee Raise 10 reps;3 seconds    Bridge Limitations 1x, increased pain    Other Supine Lumbar Exercises isometric hip abd/add 10x 5" with ball and belt      Manual Therapy   Manual Therapy Other (comment)    Other Manual Therapy Checked SI alignement  with increased pain over Lt PSIS, no LLD and equal IR/ER                      PT Short Term Goals - 09/05/20 1049       PT SHORT TERM GOAL #1   Title Patient will be independent with HEP in order to improve functional outcomes.    Time 3    Period Weeks    Status New    Target Date 09/26/20      PT SHORT TERM GOAL #2   Title Patient will report at least 25% improvement in symptoms for improved quality of life.    Baseline symptoms eminating from left buttocks, low back, lower leg    Time 3    Period Weeks    Status New    Target Date 09/26/20      PT SHORT TERM GOAL #3   Title Patient will be able to navigate stairs with reciprocal pattern without compensation in order to demonstrate improved LE strength.    Baseline step-to pattern with BHR    Time 3    Period Weeks    Status New    Target Date 09/26/20               PT Long Term Goals - 09/05/20 1052       PT LONG TERM GOAL #1   Title Patient will improve FOTO score by at least 5 points in order to indicate improved tolerance to activity.    Baseline 38% function    Time 6    Period Weeks    Status New     Target Date 10/17/20      PT LONG TERM GOAL #2   Title Patient will be able to ambulate at least 400 feet in in order to demonstrate improved gait speed for community ambulation.    Baseline 250 ft with antalgic pattern    Time 6    Period Weeks    Status New    Target Date 10/17/20      PT LONG TERM GOAL #3   Title Demo proper lifting form with core engagment to improve functional performance and reduce risk for back injury    Time 6    Period Weeks    Status New    Target Date 10/17/20                   Plan - 09/07/20 0941     Clinical Impression Statement Reviewed goals, educated importance of HEP compliance for maximal beneftis.  Pt able to recall current exercise program.and demonstrate appropriatley.  Assessed SI alignment wiht some tenderness over Lt PSIS, no LLD and equal IR/ER.  Session focus on core and proximal strengthening as well as stretches to address tightness in hip for mobility.  Pt prefers flexion based exercises wiht no reports of increased pain.    Personal Factors and Comorbidities Comorbidity 1;Time since onset of injury/illness/exacerbation;Past/Current Experience    Comorbidities surgical hx    Examination-Activity Limitations Bed Mobility;Bend;Carry;Lift;Stand;Stairs;Squat;Locomotion Level;Transfers    Examination-Participation Restrictions Cleaning;Community Activity;Driving;Laundry;Yard Work;Occupation    Stability/Clinical Decision Making Stable/Uncomplicated    Clinical Decision Making Low    Rehab Potential Good    PT Frequency 2x / week    PT Duration 6 weeks    PT Treatment/Interventions ADLs/Self Care Home Management;Aquatic Therapy;Biofeedback;Cryotherapy;Electrical Stimulation;DME Instruction;Ultrasound;Moist Heat;Gait training;Stair training;Functional mobility training;Therapeutic activities;Therapeutic exercise;Balance training;Patient/family education;Neuromuscular re-education;Manual techniques;Passive range of motion;Taping;Dry  needling;Spinal Manipulations;Joint  Manipulations    PT Next Visit Plan Continue with core stabilization and LLE stretching/piriformis    PT Home Exercise Plan SKTC, piriformis stretch (supine and sitting), PPT    Consulted and Agree with Plan of Care Patient             Patient will benefit from skilled therapeutic intervention in order to improve the following deficits and impairments:  Abnormal gait, Decreased activity tolerance, Decreased endurance, Decreased range of motion, Decreased strength, Difficulty walking, Increased muscle spasms, Impaired perceived functional ability, Impaired flexibility, Postural dysfunction, Improper body mechanics, Pain  Visit Diagnosis: Chronic low back pain, unspecified back pain laterality, unspecified whether sciatica present  Muscle weakness (generalized)  Difficulty in walking, not elsewhere classified     Problem List Patient Active Problem List   Diagnosis Date Noted   Restless leg 07/09/2019   Essential hypertension 06/02/2014   Thyroid nodule, left 08/31/2013   Hypothyroidism 08/10/2012   Hyperlipidemia 08/10/2012   Depression 08/10/2012   Chronic back pain 08/10/2012   Insomnia 08/10/2012   Becky Sax, LPTA/CLT; CBIS [phone]  Juel Burrow 09/07/2020, 11:10 AM  [place] [facility] [address] [place], [place], [address] Phone: [phone]   Fax:  [phone]  Name: Maria [name] MRN: [id number] Date of Birth: 02-Mar-1971

## 2020-09-07 NOTE — Telephone Encounter (Signed)
Last office visit 07/18/20 Last refill 07/18/20, #135, no refills

## 2020-09-12 ENCOUNTER — Telehealth (HOSPITAL_COMMUNITY): Payer: Self-pay

## 2020-09-12 ENCOUNTER — Encounter (HOSPITAL_COMMUNITY): Payer: 59

## 2020-09-12 NOTE — Telephone Encounter (Signed)
Called regarding missed appointment and reminded of next appointment date/time.  1:37 PM, 09/12/20 M. Shary Decamp, PT, DPT Physical Therapist- [place] Office Number: [phone]

## 2020-09-14 ENCOUNTER — Telehealth (HOSPITAL_COMMUNITY): Payer: Self-pay

## 2020-09-14 ENCOUNTER — Encounter (HOSPITAL_COMMUNITY): Payer: 59

## 2020-09-14 NOTE — Telephone Encounter (Signed)
2nd no show, called and left message concerning missed apt.  Reminded next apt date and time.  Included no show policy details.  Contact number included if needs to cancel/reschedule future apts.    Becky Sax, LPTA/CLT; [place] [phone]

## 2020-09-19 ENCOUNTER — Telehealth (HOSPITAL_COMMUNITY): Payer: Self-pay

## 2020-09-19 ENCOUNTER — Ambulatory Visit (HOSPITAL_COMMUNITY): Payer: 59

## 2020-09-19 NOTE — Telephone Encounter (Signed)
No show, called and left message concerning missed apt today. Included no show policy details and that we are going to discharge from PT.  Included contact information for any questions/concerns.    Becky Sax, LPTA/CLT; [place] [phone]

## 2020-09-20 ENCOUNTER — Encounter (HOSPITAL_COMMUNITY): Payer: Self-pay

## 2020-09-20 NOTE — Therapy (Signed)
[place] [address] [place], [place], [address] Phone: [phone]   Fax:  [phone]  Patient Details  Name: Maria [name] MRN: [id number] Date of Birth: 1970-06-12 Referring Provider:  No ref. provider found  Encounter Date: 09/20/2020  PHYSICAL THERAPY DISCHARGE SUMMARY  Visits from Start of Care: 1  Current functional level related to goals / functional outcomes: Unable to reassess, did not return after initial evaluation   Remaining deficits: Unable to assess   Education / Equipment: HEP initiated    Patient agrees to discharge. Patient goals were not met. Patient is being discharged due to not returning since the last visit.  8:19 AM, 09/20/20 M. Sherlyn Lees, PT, DPT Physical Therapist- [place] Office Number: [phone]   [facility] [address] [place], [place], [address] Phone: [phone]   Fax:  [phone]

## 2020-09-21 ENCOUNTER — Ambulatory Visit
Admission: RE | Admit: 2020-09-21 | Discharge: 2020-09-21 | Disposition: A | Discharge status: Home / Self Care | Payer: 59 | Source: Ambulatory Visit | Attending: Anesthesiology | Admitting: Anesthesiology

## 2020-09-21 ENCOUNTER — Other Ambulatory Visit: Payer: Self-pay | Admitting: Anesthesiology

## 2020-09-21 ENCOUNTER — Encounter (HOSPITAL_COMMUNITY): Payer: 59

## 2020-09-21 DIAGNOSIS — M25559 Pain in unspecified hip: Secondary | ICD-10-CM

## 2020-09-26 ENCOUNTER — Encounter (HOSPITAL_COMMUNITY): Payer: 59

## 2020-09-27 ENCOUNTER — Other Ambulatory Visit: Payer: Self-pay | Admitting: Anesthesiology

## 2020-09-27 DIAGNOSIS — M25559 Pain in unspecified hip: Secondary | ICD-10-CM

## 2020-09-29 ENCOUNTER — Encounter (HOSPITAL_COMMUNITY): Payer: 59

## 2020-10-03 ENCOUNTER — Other Ambulatory Visit: Payer: Self-pay

## 2020-10-03 ENCOUNTER — Encounter (HOSPITAL_COMMUNITY): Payer: 59

## 2020-10-03 ENCOUNTER — Ambulatory Visit
Admission: RE | Admit: 2020-10-03 | Discharge: 2020-10-03 | Disposition: A | Discharge status: Home / Self Care | Payer: 59 | Source: Ambulatory Visit | Attending: Anesthesiology | Admitting: Anesthesiology

## 2020-10-03 DIAGNOSIS — M25559 Pain in unspecified hip: Secondary | ICD-10-CM

## 2020-10-05 ENCOUNTER — Encounter (HOSPITAL_COMMUNITY): Payer: 59

## 2020-10-06 ENCOUNTER — Other Ambulatory Visit: Payer: Self-pay | Admitting: Nurse Practitioner

## 2020-10-07 ENCOUNTER — Other Ambulatory Visit: Payer: Self-pay | Admitting: Nurse Practitioner

## 2020-10-07 DIAGNOSIS — G8929 Other chronic pain: Secondary | ICD-10-CM

## 2020-10-10 ENCOUNTER — Encounter (HOSPITAL_COMMUNITY): Payer: 59

## 2020-10-10 ENCOUNTER — Other Ambulatory Visit: Payer: Self-pay

## 2020-10-10 DIAGNOSIS — G8929 Other chronic pain: Secondary | ICD-10-CM

## 2020-10-12 ENCOUNTER — Encounter (HOSPITAL_COMMUNITY): Payer: 59

## 2020-10-17 ENCOUNTER — Encounter (HOSPITAL_COMMUNITY): Payer: 59

## 2020-10-19 ENCOUNTER — Encounter (HOSPITAL_COMMUNITY): Payer: 59

## 2020-11-08 ENCOUNTER — Other Ambulatory Visit: Payer: Self-pay | Admitting: Nurse Practitioner

## 2020-11-08 DIAGNOSIS — G8929 Other chronic pain: Secondary | ICD-10-CM

## 2020-11-08 DIAGNOSIS — M545 Low back pain, unspecified: Secondary | ICD-10-CM

## 2020-11-15 ENCOUNTER — Other Ambulatory Visit: Payer: Self-pay | Admitting: Orthopedic Surgery

## 2020-11-15 DIAGNOSIS — M25552 Pain in left hip: Secondary | ICD-10-CM

## 2020-11-20 ENCOUNTER — Other Ambulatory Visit: Payer: Self-pay

## 2020-11-20 ENCOUNTER — Ambulatory Visit
Admission: RE | Admit: 2020-11-20 | Discharge: 2020-11-20 | Disposition: A | Discharge status: Home / Self Care | Payer: 59 | Source: Ambulatory Visit | Attending: Orthopedic Surgery | Admitting: Orthopedic Surgery

## 2020-11-20 DIAGNOSIS — M25552 Pain in left hip: Secondary | ICD-10-CM

## 2020-11-20 MED ORDER — METHYLPREDNISOLONE ACETATE 40 MG/ML INJ SUSP (RADIOLOG
80.0000 mg | Freq: Once | INTRAMUSCULAR | Status: AC
  Administered 2020-11-20: 80 mg via INTRA_ARTICULAR

## 2020-11-20 MED ORDER — IOPAMIDOL (ISOVUE-M 200) INJECTION 41%
1.0000 mL | Freq: Once | INTRAMUSCULAR | Status: AC
  Administered 2020-11-20: 1 mL via INTRA_ARTICULAR

## 2020-12-02 ENCOUNTER — Other Ambulatory Visit: Payer: Self-pay

## 2020-12-02 ENCOUNTER — Other Ambulatory Visit: Payer: Self-pay | Admitting: Nurse Practitioner

## 2020-12-02 DIAGNOSIS — G8929 Other chronic pain: Secondary | ICD-10-CM

## 2020-12-02 DIAGNOSIS — M545 Low back pain, unspecified: Secondary | ICD-10-CM

## 2020-12-09 ENCOUNTER — Other Ambulatory Visit: Payer: Self-pay | Admitting: Nurse Practitioner

## 2020-12-09 DIAGNOSIS — G2581 Restless legs syndrome: Secondary | ICD-10-CM

## 2021-01-07 ENCOUNTER — Other Ambulatory Visit: Payer: Self-pay | Admitting: Nurse Practitioner

## 2021-01-07 DIAGNOSIS — M545 Low back pain, unspecified: Secondary | ICD-10-CM

## 2021-01-18 ENCOUNTER — Other Ambulatory Visit: Payer: Self-pay

## 2021-01-18 ENCOUNTER — Encounter: Payer: Self-pay | Admitting: Nurse Practitioner

## 2021-01-18 ENCOUNTER — Ambulatory Visit: Payer: 59 | Admitting: Nurse Practitioner

## 2021-01-18 VITALS — BP 109/68 | HR 74 | Temp 98.1°F | Ht 70.0 in | Wt 204.4 lb

## 2021-01-18 DIAGNOSIS — F339 Major depressive disorder, recurrent, unspecified: Secondary | ICD-10-CM

## 2021-01-18 DIAGNOSIS — E039 Hypothyroidism, unspecified: Secondary | ICD-10-CM

## 2021-01-18 DIAGNOSIS — E782 Mixed hyperlipidemia: Secondary | ICD-10-CM

## 2021-01-18 DIAGNOSIS — M545 Low back pain, unspecified: Secondary | ICD-10-CM

## 2021-01-18 DIAGNOSIS — I10 Essential (primary) hypertension: Secondary | ICD-10-CM

## 2021-01-18 DIAGNOSIS — R609 Edema, unspecified: Secondary | ICD-10-CM

## 2021-01-18 DIAGNOSIS — F5101 Primary insomnia: Secondary | ICD-10-CM

## 2021-01-18 DIAGNOSIS — G2581 Restless legs syndrome: Secondary | ICD-10-CM

## 2021-01-18 DIAGNOSIS — G8929 Other chronic pain: Secondary | ICD-10-CM

## 2021-01-18 MED ORDER — AMLODIPINE BESY-BENAZEPRIL HCL 10-20 MG PO CAPS: 1.0000 | ORAL_CAPSULE | Freq: Every day | ORAL | 1 refills | Status: DC

## 2021-01-18 MED ORDER — PRAMIPEXOLE DIHYDROCHLORIDE 0.5 MG PO TABS: 0.5000 mg | ORAL_TABLET | Freq: Three times a day (TID) | ORAL | 1 refills | Status: DC

## 2021-01-18 MED ORDER — FUROSEMIDE 20 MG PO TABS: 20.0000 mg | ORAL_TABLET | Freq: Every day | ORAL | 1 refills | Status: DC

## 2021-01-18 MED ORDER — TIZANIDINE HCL 4 MG PO TABS: 6.0000 mg | ORAL_TABLET | Freq: Three times a day (TID) | ORAL | 1 refills | Status: DC

## 2021-01-18 MED ORDER — GABAPENTIN 400 MG PO CAPS: 1200.0000 mg | ORAL_CAPSULE | Freq: Every day | ORAL | 1 refills | Status: DC

## 2021-01-18 MED ORDER — IBUPROFEN 800 MG PO TABS: 800.0000 mg | ORAL_TABLET | Freq: Three times a day (TID) | ORAL | 2 refills | Status: DC | PRN

## 2021-01-18 NOTE — Progress Notes (Signed)
Subjective:    Patient ID: Maria [name], female    DOB: [birth year], 50 y.o.   MRN: [id number]  Chief Complaint: Medical Management of Chronic Issues    HPI:  1. Essential hypertension No c/o chest pain, sob or headache. Does not check blood pressure at home. BP Readings from Last 3 Encounters:  01/18/21 109/68  07/18/20 136/85  05/02/20 126/78     2. Mixed hyperlipidemia Does try to watch diet. No dedicated exercise. Lab Results  Component Value Date   CHOL 229 (H) 07/18/2020   HDL 66 07/18/2020   LDLCALC 141 (H) 07/18/2020   TRIG 126 07/18/2020   CHOLHDL 3.5 07/18/2020     3. Acquired hypothyroidism No problems Lab Results  Component Value Date   TSH 0.486 07/18/2020     4. Recurrent major depressive disorder, remission status unspecified (HCC) Is not on any meds right now. Depression screen [facility] 2/9 07/18/2020 05/02/2020 01/17/2020  Decreased Interest 0 0 0  Down, Depressed, Hopeless 1 0 0  PHQ - 2 Score 1 0 0  Altered sleeping 0 - 0  Tired, decreased energy 0 - 0  Change in appetite 0 - 0  Feeling bad or failure about yourself  0 - 0  Trouble concentrating 0 - 0  Moving slowly or fidgety/restless 0 - 0  Suicidal thoughts 0 - 0  PHQ-9 Score 1 - 0  Difficult doing work/chores Not difficult at all - Not difficult at all     5. Primary insomnia Sleeping well some nights.  6. Restless leg Is on mirapex and that keeps legs calm at night.    Outpatient Encounter Medications as of 01/18/2021  Medication Sig   amLODipine-benazepril (LOTREL) 10-20 MG capsule Take 1 capsule by mouth daily.   furosemide (LASIX) 20 MG tablet Take 1 tablet (20 mg total) by mouth daily.   gabapentin (NEURONTIN) 400 MG capsule Take 3 capsules (1,200 mg total) by mouth at bedtime.   Multiple Vitamin (MULTIVITAMIN) tablet Take 1 tablet by mouth daily.   pramipexole (MIRAPEX) 0.5 MG tablet TAKE 1 TABLET BY MOUTH 3 TIMES DAILY.   PREMPRO 0.3-1.5 MG tablet TAKE 1 TABLET BY MOUTH  EVERY DAY   tiZANidine (ZANAFLEX) 4 MG tablet TAKE 1.5 TABLETS (6 MG TOTAL) BY MOUTH 3 (THREE) TIMES DAILY.   [DISCONTINUED] predniSONE (STERAPRED UNI-PAK 21 TAB) 10 MG (21) TBPK tablet As directed x 6 days   No facility-administered encounter medications on file as of 01/18/2021.    Past Surgical History:  Procedure Laterality Date   BACK SURGERY  2018   BREAST SURGERY     lumpectomy   CESAREAN SECTION     CHOLECYSTECTOMY     ENDOMETRIAL ABLATION     l5-s1 fusion  2008, 2009, 2011   THYROIDECTOMY Right 2001    Family History  Problem Relation Age of Onset   Hypertension Mother    Cancer Mother        breast   Hypothyroidism Mother    Hypertension Father    Hypothyroidism Father     New complaints: None today  Social history: Lives with husband and son  Controlled substance contract: n/a     Review of Systems  Constitutional:  Negative for diaphoresis.  Eyes:  Negative for pain.  Respiratory:  Negative for shortness of breath.   Cardiovascular:  Negative for chest pain, palpitations and leg swelling.  Gastrointestinal:  Negative for abdominal pain.  Endocrine: Negative for polydipsia.  Skin:  Negative for rash.  Neurological:  Negative for dizziness, weakness and headaches.  Hematological:  Does not bruise/bleed easily.  All other systems reviewed and are negative.     Objective:   Physical Exam Vitals and nursing note reviewed.  Constitutional:      General: She is not in acute distress.    Appearance: Normal appearance. She is well-developed.  HENT:     Head: Normocephalic.     Right Ear: Tympanic membrane normal.     Left Ear: Tympanic membrane normal.     Nose: Nose normal.     Mouth/Throat:     Mouth: Mucous membranes are moist.  Eyes:     Pupils: Pupils are equal, round, and reactive to light.  Neck:     Vascular: No carotid bruit or JVD.  Cardiovascular:     Rate and Rhythm: Normal rate and regular rhythm.     Heart sounds: Normal heart  sounds.  Pulmonary:     Effort: Pulmonary effort is normal. No respiratory distress.     Breath sounds: Normal breath sounds. No wheezing or rales.  Chest:     Chest wall: No tenderness.  Abdominal:     General: Bowel sounds are normal. There is no distension or abdominal bruit.     Palpations: Abdomen is soft. There is no hepatomegaly, splenomegaly, mass or pulsatile mass.     Tenderness: There is no abdominal tenderness.  Musculoskeletal:        General: Normal range of motion.     Cervical back: Normal range of motion and neck supple.  Lymphadenopathy:     Cervical: No cervical adenopathy.  Skin:    General: Skin is warm and dry.  Neurological:     Mental Status: She is alert and oriented to person, place, and time.     Deep Tendon Reflexes: Reflexes are normal and symmetric.  Psychiatric:        Behavior: Behavior normal.        Thought Content: Thought content normal.        Judgment: Judgment normal.    BP 109/68   Pulse 74   Temp 98.1 F (36.7 C)   Ht 5\' 10"  (1.778 m)   Wt 204 lb 6.4 oz (92.7 kg)   SpO2 96%   BMI 29.33 kg/m        Assessment & Plan:   IRISH BREISCH comes in today with chief complaint of Medical Management of Chronic Issues   Diagnosis and orders addressed:  1. Essential hypertension Low sodium diet - amLODipine-benazepril (LOTREL) 10-20 MG capsule; Take 1 capsule by mouth daily.  Dispense: 90 capsule; Refill: 1  2. Mixed hyperlipidemia Low fat diet  3. Acquired hypothyroidism Labs pending  4. Recurrent major depressive disorder, remission status unspecified (HCC) Stress management  5. Primary insomnia Bedtime routine  6. Restless leg Keep legs warm at night - pramipexole (MIRAPEX) 0.5 MG tablet; Take 1 tablet (0.5 mg total) by mouth 3 (three) times daily.  Dispense: 270 tablet; Refill: 1  7. Chronic midline low back pain without sciatica - tiZANidine (ZANAFLEX) 4 MG tablet; Take 1.5 tablets (6 mg total) by mouth 3 (three)  times daily.  Dispense: 135 tablet; Refill: 1 - gabapentin (NEURONTIN) 400 MG capsule; Take 3 capsules (1,200 mg total) by mouth at bedtime.  Dispense: 270 capsule; Refill: 1  8. Peripheral edema Elevate legs when sitting - furosemide (LASIX) 20 MG tablet; Take 1 tablet (20 mg total) by mouth daily.  Dispense: 90 tablet; Refill: 1  Labs pending Health Maintenance reviewed Diet and exercise encouraged  Follow up plan: 6 months   Mary-Margaret Hassell Done, FNP

## 2021-01-18 NOTE — Addendum Note (Signed)
Addended by: Fara Olden on: 01/18/2021 11:22 AM   Modules accepted: Orders

## 2021-01-18 NOTE — Addendum Note (Signed)
Addended by: Bennie Pierini on: 01/18/2021 11:11 AM   Modules accepted: Orders

## 2021-01-19 LAB — CBC WITH DIFFERENTIAL/PLATELET
Basophils Absolute: 0.1 10*3/uL (ref 0.0–0.2)
Basos: 1 %
EOS (ABSOLUTE): 0.2 10*3/uL (ref 0.0–0.4)
Eos: 2 %
Hematocrit: 40.2 % (ref 34.0–46.6)
Hemoglobin: 13.6 g/dL (ref 11.1–15.9)
Immature Grans (Abs): 0 10*3/uL (ref 0.0–0.1)
Immature Granulocytes: 0 %
Lymphocytes Absolute: 2.1 10*3/uL (ref 0.7–3.1)
Lymphs: 22 %
MCH: 32.4 pg (ref 26.6–33.0)
MCHC: 33.8 g/dL (ref 31.5–35.7)
MCV: 96 fL (ref 79–97)
Monocytes Absolute: 0.4 10*3/uL (ref 0.1–0.9)
Monocytes: 4 %
Neutrophils Absolute: 6.6 10*3/uL (ref 1.4–7.0)
Neutrophils: 71 %
Platelets: 376 10*3/uL (ref 150–450)
RBC: 4.2 x10E6/uL (ref 3.77–5.28)
RDW: 12.5 % (ref 11.7–15.4)
WBC: 9.4 10*3/uL (ref 3.4–10.8)

## 2021-01-19 LAB — CMP14+EGFR
ALT: 21 IU/L (ref 0–32)
AST: 24 IU/L (ref 0–40)
Albumin/Globulin Ratio: 2 (ref 1.2–2.2)
Albumin: 4.5 g/dL (ref 3.8–4.9)
Alkaline Phosphatase: 100 IU/L (ref 44–121)
BUN/Creatinine Ratio: 18 (ref 9–23)
BUN: 14 mg/dL (ref 6–24)
Bilirubin Total: 0.2 mg/dL (ref 0.0–1.2)
CO2: 23 mmol/L (ref 20–29)
Calcium: 9.4 mg/dL (ref 8.7–10.2)
Chloride: 106 mmol/L (ref 96–106)
Creatinine, Ser: 0.76 mg/dL (ref 0.57–1.00)
Globulin, Total: 2.3 g/dL (ref 1.5–4.5)
Glucose: 97 mg/dL (ref 70–99)
Potassium: 4.2 mmol/L (ref 3.5–5.2)
Sodium: 141 mmol/L (ref 134–144)
Total Protein: 6.8 g/dL (ref 6.0–8.5)
eGFR: 95 mL/min/{1.73_m2} (ref >59)

## 2021-01-19 LAB — LIPID PANEL
Chol/HDL Ratio: 3.6 ratio (ref 0.0–4.4)
Cholesterol, Total: 233 mg/dL — ABNORMAL HIGH (ref 100–199)
HDL: 64 mg/dL (ref >39)
LDL Chol Calc (NIH): 144 mg/dL — ABNORMAL HIGH (ref 0–99)
Triglycerides: 142 mg/dL (ref 0–149)
VLDL Cholesterol Cal: 25 mg/dL (ref 5–40)

## 2021-01-19 LAB — THYROID PANEL WITH TSH
Free Thyroxine Index: 1.9 (ref 1.2–4.9)
T3 Uptake Ratio: 24 % (ref 24–39)
T4, Total: 7.8 ug/dL (ref 4.5–12.0)
TSH: 0.483 u[IU]/mL (ref 0.450–4.500)

## 2021-02-27 ENCOUNTER — Other Ambulatory Visit: Payer: Self-pay | Admitting: Nurse Practitioner

## 2021-02-27 MED ORDER — VALACYCLOVIR HCL 1 G PO TABS: 2000.0000 mg | ORAL_TABLET | Freq: Two times a day (BID) | ORAL | 0 refills | Status: AC

## 2021-03-26 ENCOUNTER — Other Ambulatory Visit: Payer: Self-pay | Admitting: Nurse Practitioner

## 2021-03-26 DIAGNOSIS — G8929 Other chronic pain: Secondary | ICD-10-CM

## 2021-03-28 ENCOUNTER — Other Ambulatory Visit: Payer: Self-pay | Admitting: Nurse Practitioner

## 2021-03-28 DIAGNOSIS — M545 Low back pain, unspecified: Secondary | ICD-10-CM

## 2021-03-28 MED ORDER — IBUPROFEN 800 MG PO TABS: ORAL_TABLET | ORAL | 1 refills | Status: DC

## 2021-03-28 MED ORDER — TIZANIDINE HCL 4 MG PO TABS: ORAL_TABLET | ORAL | 0 refills | Status: DC

## 2021-03-30 ENCOUNTER — Other Ambulatory Visit: Payer: Self-pay | Admitting: Nurse Practitioner

## 2021-05-01 ENCOUNTER — Other Ambulatory Visit (HOSPITAL_COMMUNITY): Payer: Self-pay | Admitting: Nurse Practitioner

## 2021-05-01 ENCOUNTER — Other Ambulatory Visit: Payer: Self-pay | Admitting: *Deleted

## 2021-05-01 DIAGNOSIS — Z1231 Encounter for screening mammogram for malignant neoplasm of breast: Secondary | ICD-10-CM

## 2021-05-01 MED ORDER — IBUPROFEN 800 MG PO TABS: 800.0000 mg | ORAL_TABLET | Freq: Three times a day (TID) | ORAL | 2 refills | Status: DC | PRN

## 2021-05-03 ENCOUNTER — Other Ambulatory Visit: Payer: Self-pay | Admitting: Nurse Practitioner

## 2021-05-03 DIAGNOSIS — M545 Low back pain, unspecified: Secondary | ICD-10-CM

## 2021-05-03 DIAGNOSIS — G8929 Other chronic pain: Secondary | ICD-10-CM

## 2021-05-07 ENCOUNTER — Other Ambulatory Visit: Payer: Self-pay | Admitting: Nurse Practitioner

## 2021-05-29 ENCOUNTER — Other Ambulatory Visit: Payer: Self-pay | Admitting: Nurse Practitioner

## 2021-05-29 DIAGNOSIS — G8929 Other chronic pain: Secondary | ICD-10-CM

## 2021-06-18 ENCOUNTER — Ambulatory Visit (HOSPITAL_COMMUNITY): Payer: 59

## 2021-06-20 ENCOUNTER — Other Ambulatory Visit: Payer: Self-pay | Admitting: Family Medicine

## 2021-06-20 DIAGNOSIS — G8929 Other chronic pain: Secondary | ICD-10-CM

## 2021-07-04 ENCOUNTER — Other Ambulatory Visit: Payer: Self-pay | Admitting: Nurse Practitioner

## 2021-07-04 DIAGNOSIS — I10 Essential (primary) hypertension: Secondary | ICD-10-CM

## 2021-07-23 ENCOUNTER — Other Ambulatory Visit: Payer: Self-pay | Admitting: Nurse Practitioner

## 2021-07-23 ENCOUNTER — Encounter: Payer: Self-pay | Admitting: Nurse Practitioner

## 2021-07-23 ENCOUNTER — Ambulatory Visit: Payer: 59 | Admitting: Nurse Practitioner

## 2021-07-23 ENCOUNTER — Ambulatory Visit
Admission: RE | Admit: 2021-07-23 | Discharge: 2021-07-23 | Disposition: A | Discharge status: Home / Self Care | Payer: 59 | Source: Ambulatory Visit | Attending: Nurse Practitioner | Admitting: Nurse Practitioner

## 2021-07-23 VITALS — BP 105/68 | HR 67 | Temp 97.8°F | Resp 20 | Ht 70.0 in | Wt 197.0 lb

## 2021-07-23 DIAGNOSIS — F339 Major depressive disorder, recurrent, unspecified: Secondary | ICD-10-CM

## 2021-07-23 DIAGNOSIS — E039 Hypothyroidism, unspecified: Secondary | ICD-10-CM

## 2021-07-23 DIAGNOSIS — Z1231 Encounter for screening mammogram for malignant neoplasm of breast: Secondary | ICD-10-CM

## 2021-07-23 DIAGNOSIS — F5101 Primary insomnia: Secondary | ICD-10-CM

## 2021-07-23 DIAGNOSIS — G8929 Other chronic pain: Secondary | ICD-10-CM

## 2021-07-23 DIAGNOSIS — Z6828 Body mass index (BMI) 28.0-28.9, adult: Secondary | ICD-10-CM | POA: Insufficient documentation

## 2021-07-23 DIAGNOSIS — E782 Mixed hyperlipidemia: Secondary | ICD-10-CM

## 2021-07-23 DIAGNOSIS — I10 Essential (primary) hypertension: Secondary | ICD-10-CM | POA: Diagnosis not present

## 2021-07-23 DIAGNOSIS — M545 Low back pain, unspecified: Secondary | ICD-10-CM

## 2021-07-23 DIAGNOSIS — G2581 Restless legs syndrome: Secondary | ICD-10-CM | POA: Diagnosis not present

## 2021-07-23 DIAGNOSIS — R609 Edema, unspecified: Secondary | ICD-10-CM

## 2021-07-23 MED ORDER — GABAPENTIN 400 MG PO CAPS: 1200.0000 mg | ORAL_CAPSULE | Freq: Every day | ORAL | 1 refills | Status: DC

## 2021-07-23 MED ORDER — AMLODIPINE BESY-BENAZEPRIL HCL 10-20 MG PO CAPS: ORAL_CAPSULE | ORAL | 1 refills | Status: DC

## 2021-07-23 MED ORDER — PREMPRO 0.3-1.5 MG PO TABS: 1.0000 | ORAL_TABLET | Freq: Every day | ORAL | 1 refills | Status: DC

## 2021-07-23 MED ORDER — TIZANIDINE HCL 4 MG PO TABS: 4.0000 mg | ORAL_TABLET | Freq: Three times a day (TID) | ORAL | 1 refills | Status: DC

## 2021-07-23 MED ORDER — FUROSEMIDE 20 MG PO TABS: 20.0000 mg | ORAL_TABLET | Freq: Every day | ORAL | 1 refills | Status: DC

## 2021-07-23 NOTE — Addendum Note (Signed)
Addended by: Bennie Pierini on: 07/23/2021 12:17 PM ? ? Modules accepted: Orders ? ?

## 2021-07-23 NOTE — Progress Notes (Signed)
? ?Subjective:  ? ? Patient ID: Maria [name], female    DOB: 1970-09-01, 51 y.o.   MRN: [id number] ? ? ?Chief Complaint: Medical Management of Chronic Issues ?  ? ?HPI: ? ?Maria [name] is a 51 y.o. who identifies as a female who was assigned female at birth.  ? ?Social history: ?Lives with: husband ?Work history: has been out of work for 2 months ? ? ?Comes in today for follow up of the following chronic medical issues: ? ?1. Essential hypertension ?No c/o chest pain, sob or headache. Does not chceck blood pressure at home. ?BP Readings from Last 3 Encounters:  ?07/23/21 105/68  ?01/18/21 109/68  ?07/18/20 136/85  ? ? ? ?2. Mixed hyperlipidemia ?Does try to watch diet but does not do much exercise. ?Lab Results  ?Component Value Date  ? CHOL 233 (H) 01/18/2021  ? HDL 64 01/18/2021  ? [place] 144 (H) 01/18/2021  ? TRIG 142 01/18/2021  ? CHOLHDL 3.6 01/18/2021  ? ? ? ?3. Acquired hypothyroidism ?No c/o fatigue ?Lab Results  ?Component Value Date  ? TSH 0.483 01/18/2021  ? ? ? ?4. Restless leg ?Is on mirapex at night and is working [employer]. For her. ? ?5. Recurrent major depressive disorder, remission status unspecified ([place]) ?Is currently on no meds and is doing well. \ ? ?  07/23/2021  ? 11:44 AM 07/18/2020  ? 10:40 AM 05/02/2020  ?  8:42 AM  ?Depression screen PHQ 2/9  ?Decreased Interest 0 0 0  ?Down, Depressed, Hopeless 0 1 0  ?PHQ - 2 Score 0 1 0  ?Altered sleeping 1 0   ?Tired, decreased energy 0 0   ?Change in appetite 0 0   ?Feeling bad or failure about yourself  0 0   ?Trouble concentrating 0 0   ?Moving slowly or fidgety/restless 0 0   ?Suicidal thoughts 0 0   ?PHQ-9 Score 1 1   ?Difficult doing work/chores Not difficult at all Not difficult at all   ? ? ? ?6. Primary insomnia ?Is sleeping well with no medication. ? ?7. Chronic midline low back pain without sciatica ?No back pain anymore. Had left total hip replacement 2 months ago and is doing well. ? ?8. BMI 28.0-28.9,adult ?Weight is down 7lbs ?Wt Readings  from Last 3 Encounters:  ?07/23/21 197 lb (89.4 kg)  ?01/18/21 204 lb 6.4 oz (92.7 kg)  ?07/18/20 206 lb (93.4 kg)  ? ?BMI Readings from Last 3 Encounters:  ?07/23/21 28.27 kg/m?  ?01/18/21 29.33 kg/m?  ?07/18/20 29.56 kg/m?  ? ? ? ? ?New complaints: ?None today ? ?Allergies  ?Allergen Reactions  ? Compazine [Prochlorperazine Edisylate]   ? Sulfa Antibiotics Rash  ? ?Outpatient Encounter Medications as of 07/23/2021  ?Medication Sig  ? amLODipine-benazepril (LOTREL) 10-20 MG capsule TAKE (1) CAPSULE BY MOUTH EVERY DAY.  ? furosemide (LASIX) 20 MG tablet Take 1 tablet (20 mg total) by mouth daily.  ? gabapentin (NEURONTIN) 400 MG capsule Take 3 capsules (1,200 mg total) by mouth at bedtime.  ? ibuprofen (IBU) 800 MG tablet Take 1 tablet (800 mg total) by mouth every 8 (eight) hours as needed.  ? Multiple Vitamin (MULTIVITAMIN) tablet Take 1 tablet by mouth daily.  ? pramipexole (MIRAPEX) 0.5 MG tablet Take 1 tablet (0.5 mg total) by mouth 3 (three) times daily.  ? PREMPRO 0.3-1.5 MG tablet TAKE 1 TABLET BY MOUTH EVERY DAY  ? tiZANidine (ZANAFLEX) 4 MG tablet TAKE 1&1/2 TABLET BY MOUTH THREE TIMES DAILY.  ?  traMADol (ULTRAM) 50 MG tablet Take 100 mg by mouth every 6 (six) hours as needed.  ? ?No facility-administered encounter medications on file as of 07/23/2021.  ? ? ?Past Surgical History:  ?Procedure Laterality Date  ? BACK SURGERY  2018  ? BREAST EXCISIONAL BIOPSY Right 1993  ? BREAST SURGERY    ? lumpectomy  ? CESAREAN SECTION    ? CHOLECYSTECTOMY    ? ENDOMETRIAL ABLATION    ? l5-s1 fusion  2008, 2009, 2011  ? THYROIDECTOMY Right 2001  ? TOTAL HIP ARTHROPLASTY Left   ? ? ?Family History  ?Problem Relation Age of Onset  ? Breast cancer Mother   ? Hypertension Mother   ? Cancer Mother   ?     breast  ? Hypothyroidism Mother   ? Hypertension Father   ? Hypothyroidism Father   ? ? ? ? ?Controlled substance contract: n/a ? ? ? ? ?Review of Systems  ?Constitutional:  Negative for diaphoresis.  ?Eyes:  Negative for  pain.  ?Respiratory:  Negative for shortness of breath.   ?Cardiovascular:  Negative for chest pain, palpitations and leg swelling.  ?Gastrointestinal:  Negative for abdominal pain.  ?Endocrine: Negative for polydipsia.  ?Skin:  Negative for rash.  ?Neurological:  Negative for dizziness, weakness and headaches.  ?Hematological:  Does not bruise/bleed easily.  ?All other systems reviewed and are negative. ? ?   ?Objective:  ? Physical Exam ?Vitals and nursing note reviewed.  ?Constitutional:   ?   General: She is not in acute distress. ?   Appearance: Normal appearance. She is well-developed.  ?HENT:  ?   Head: Normocephalic.  ?   Right Ear: Tympanic membrane normal.  ?   Left Ear: Tympanic membrane normal.  ?   Nose: Nose normal.  ?   Mouth/Throat:  ?   Mouth: Mucous membranes are moist.  ?Eyes:  ?   Pupils: Pupils are equal, round, and reactive to light.  ?Neck:  ?   Vascular: No carotid bruit or JVD.  ?Cardiovascular:  ?   Rate and Rhythm: Normal rate and regular rhythm.  ?   Heart sounds: Normal heart sounds.  ?Pulmonary:  ?   Effort: Pulmonary effort is normal. No respiratory distress.  ?   Breath sounds: Normal breath sounds. No wheezing or rales.  ?Chest:  ?   Chest wall: No tenderness.  ?Abdominal:  ?   General: Bowel sounds are normal. There is no distension or abdominal bruit.  ?   Palpations: Abdomen is soft. There is no hepatomegaly, splenomegaly, mass or pulsatile mass.  ?   Tenderness: There is no abdominal tenderness.  ?Musculoskeletal:     ?   General: Normal range of motion.  ?   Cervical back: Normal range of motion and neck supple.  ?Lymphadenopathy:  ?   Cervical: No cervical adenopathy.  ?Skin: ?   General: Skin is warm and dry.  ?Neurological:  ?   Mental Status: She is alert and oriented to person, place, and time.  ?   Deep Tendon Reflexes: Reflexes are normal and symmetric.  ?Psychiatric:     ?   Behavior: Behavior normal.     ?   Thought Content: Thought content normal.     ?   Judgment:  Judgment normal.  ? ? ? ? ?BP 105/68   Pulse 67   Temp 97.8 ?F (36.6 ?C) (Temporal)   Resp 20   Ht 5\' 10"  (1.778 m)   Wt 197 lb (  89.4 kg)   SpO2 98%   BMI 28.27 kg/m?  ? ?   ?Assessment & Plan:  ?Maria [name] comes in today with chief complaint of Medical Management of Chronic Issues ? ? ?Diagnosis and orders addressed: ? ?1. Essential hypertension ?Low sodium diet ?- amLODipine-benazepril (LOTREL) 10-20 MG capsule; TAKE (1) CAPSULE BY MOUTH EVERY DAY.  Dispense: 90 capsule; Refill: 1 ? ?2. Mixed hyperlipidemia ?Low fat diet ? ?3. Acquired hypothyroidism ?Labs pending ? ?4. Restless leg ?Continue mirapex ? ?5. Recurrent major depressive disorder, remission status unspecified ([place]) ?Stress management ? ?6. Primary insomnia ?Bedtime routine ? ?7. Chronic midline low back pain without sciatica ?Moist heat ?rest ?- tiZANidine (ZANAFLEX) 4 MG tablet; Take 1 tablet (4 mg total) by mouth 3 (three) times daily.  Dispense: 135 tablet; Refill: 1 ?- gabapentin (NEURONTIN) 400 MG capsule; Take 3 capsules (1,200 mg total) by mouth at bedtime.  Dispense: 270 capsule; Refill: 1 ? ?8. BMI 28.0-28.9,adult ?Discussed diet and exercise for person with BMI >25 ?Will recheck weight in 3-6 months ? ? ?9. Peripheral edema ?Elevate when sitting ?- furosemide (LASIX) 20 MG tablet; Take 1 tablet (20 mg total) by mouth daily.  Dispense: 90 tablet; Refill: 1 ? ? ?Labs pending ?Health Maintenance reviewed ?Diet and exercise encouraged ? ?Follow up plan: ?6 months ? ? ?Mary-Margaret Hassell Done, FNP ? ? ?

## 2021-07-23 NOTE — Patient Instructions (Signed)
Peripheral Edema  Peripheral edema is swelling that is caused by a buildup of fluid. Peripheral edema most often affects the lower legs, ankles, and feet. It can also develop in the arms, hands, and face. The area of the body that has peripheral edema will look swollen. It may also feel heavy or warm. Your clothes may start to feel tight. Pressing on the area may make a temporary dent in your skin (pitting edema). You may not be able to move your swollen arm or leg as much as usual. There are many causes of peripheral edema. It can happen because of a complication of other conditions such as heart failure, kidney disease, or a problem with your circulation. It also can be a side effect of certain medicines or happen because of an infection. It often happens to women during pregnancy. Sometimes, the cause is not known. Follow these instructions at home: Managing pain, stiffness, and swelling  Raise (elevate) your legs while you are sitting or lying down. Move around often to prevent stiffness and to reduce swelling. Do not sit or stand for long periods of time. Do not wear tight clothing. Do not wear garters on your upper legs. Exercise your legs to get your circulation going. This helps to move the fluid back into your blood vessels, and it may help the swelling go down. Wear compression stockings as told by your health care provider. These stockings help to prevent blood clots and reduce swelling in your legs. It is important that these are the correct size. These stockings should be prescribed by your doctor to prevent possible injuries. If elastic bandages or wraps are recommended, use them as told by your health care provider. Medicines Take over-the-counter and prescription medicines only as told by your health care provider. Your health care provider may prescribe medicine to help your body get rid of excess water (diuretic). Take this medicine if you are told to take it. General  instructions Eat a low-salt (low-sodium) diet as told by your health care provider. Sometimes, eating less salt may reduce swelling. Pay attention to any changes in your symptoms. Moisturize your skin daily to help prevent skin from cracking and draining. Keep all follow-up visits. This is important. Contact a health care provider if: You have a fever. You have swelling in only one leg. You have increased swelling, redness, or pain in one or both of your legs. You have drainage or sores at the area where you have edema. Get help right away if: You have edema that starts suddenly or is getting worse, especially if you are pregnant or have a medical condition. You develop shortness of breath, especially when you are lying down. You have pain in your chest or abdomen. You feel weak. You feel like you will faint. These symptoms may be an emergency. Get help right away. Call 911. Do not wait to see if the symptoms will go away. Do not drive yourself to the hospital. Summary Peripheral edema is swelling that is caused by a buildup of fluid. Peripheral edema most often affects the lower legs, ankles, and feet. Move around often to prevent stiffness and to reduce swelling. Do not sit or stand for long periods of time. Pay attention to any changes in your symptoms. Contact a health care provider if you have edema that starts suddenly or is getting worse, especially if you are pregnant or have a medical condition. Get help right away if you develop shortness of breath, especially when lying down.   This information is not intended to replace advice given to you by your health care provider. Make sure you discuss any questions you have with your health care provider. Document Revised: 11/20/2020 Document Reviewed: 11/20/2020 Elsevier Patient Education  2023 [employer]  

## 2021-07-25 LAB — CBC WITH DIFFERENTIAL/PLATELET
Basophils Absolute: 0.1 10*3/uL (ref 0.0–0.2)
Basos: 1 %
EOS (ABSOLUTE): 0.4 10*3/uL (ref 0.0–0.4)
Eos: 5 %
Hematocrit: 37.7 % (ref 34.0–46.6)
Hemoglobin: 12.8 g/dL (ref 11.1–15.9)
Immature Grans (Abs): 0.1 10*3/uL (ref 0.0–0.1)
Immature Granulocytes: 1 %
Lymphocytes Absolute: 2.4 10*3/uL (ref 0.7–3.1)
Lymphs: 33 %
MCH: 32.1 pg (ref 26.6–33.0)
MCHC: 34 g/dL (ref 31.5–35.7)
MCV: 95 fL (ref 79–97)
Monocytes Absolute: 0.4 10*3/uL (ref 0.1–0.9)
Monocytes: 5 %
Neutrophils Absolute: 4 10*3/uL (ref 1.4–7.0)
Neutrophils: 55 %
Platelets: 402 10*3/uL (ref 150–450)
RBC: 3.99 x10E6/uL (ref 3.77–5.28)
RDW: 12.9 % (ref 11.7–15.4)
WBC: 7.2 10*3/uL (ref 3.4–10.8)

## 2021-07-25 LAB — CMP14+EGFR
ALT: 17 IU/L (ref 0–32)
AST: 18 IU/L (ref 0–40)
Albumin/Globulin Ratio: 1.8 (ref 1.2–2.2)
Albumin: 4.1 g/dL (ref 3.8–4.9)
Alkaline Phosphatase: 104 IU/L (ref 44–121)
BUN/Creatinine Ratio: 15 (ref 9–23)
BUN: 11 mg/dL (ref 6–24)
Bilirubin Total: <0.2 mg/dL (ref 0.0–1.2)
CO2: 23 mmol/L (ref 20–29)
Calcium: 9.6 mg/dL (ref 8.7–10.2)
Chloride: 107 mmol/L — ABNORMAL HIGH (ref 96–106)
Creatinine, Ser: 0.74 mg/dL (ref 0.57–1.00)
Globulin, Total: 2.3 g/dL (ref 1.5–4.5)
Glucose: 97 mg/dL (ref 70–99)
Potassium: 3.9 mmol/L (ref 3.5–5.2)
Sodium: 146 mmol/L — ABNORMAL HIGH (ref 134–144)
Total Protein: 6.4 g/dL (ref 6.0–8.5)
eGFR: 97 mL/min/{1.73_m2} (ref >59)

## 2021-07-25 LAB — LIPID PANEL
Chol/HDL Ratio: 3.9 ratio (ref 0.0–4.4)
Cholesterol, Total: 205 mg/dL — ABNORMAL HIGH (ref 100–199)
HDL: 52 mg/dL (ref >39)
LDL Chol Calc (NIH): 120 mg/dL — ABNORMAL HIGH (ref 0–99)
Triglycerides: 186 mg/dL — ABNORMAL HIGH (ref 0–149)
VLDL Cholesterol Cal: 33 mg/dL (ref 5–40)

## 2021-08-15 ENCOUNTER — Other Ambulatory Visit: Payer: Self-pay | Admitting: Nurse Practitioner

## 2021-08-15 DIAGNOSIS — G2581 Restless legs syndrome: Secondary | ICD-10-CM

## 2021-08-16 ENCOUNTER — Other Ambulatory Visit: Payer: Self-pay | Admitting: Anesthesiology

## 2021-08-16 ENCOUNTER — Ambulatory Visit
Admission: RE | Admit: 2021-08-16 | Discharge: 2021-08-16 | Disposition: A | Discharge status: Home / Self Care | Payer: 59 | Source: Ambulatory Visit | Attending: Anesthesiology | Admitting: Anesthesiology

## 2021-08-16 DIAGNOSIS — R52 Pain, unspecified: Secondary | ICD-10-CM

## 2021-09-05 ENCOUNTER — Other Ambulatory Visit: Payer: Self-pay | Admitting: Nurse Practitioner

## 2021-09-05 DIAGNOSIS — G8929 Other chronic pain: Secondary | ICD-10-CM

## 2021-09-10 ENCOUNTER — Other Ambulatory Visit: Payer: Self-pay | Admitting: Nurse Practitioner

## 2021-09-10 DIAGNOSIS — M2550 Pain in unspecified joint: Secondary | ICD-10-CM

## 2021-09-24 ENCOUNTER — Other Ambulatory Visit: Payer: Self-pay | Admitting: Nurse Practitioner

## 2021-09-24 DIAGNOSIS — M545 Low back pain, unspecified: Secondary | ICD-10-CM

## 2021-09-24 MED ORDER — TIZANIDINE HCL 4 MG PO TABS: 4.0000 mg | ORAL_TABLET | Freq: Three times a day (TID) | ORAL | 0 refills | Status: DC

## 2021-09-27 ENCOUNTER — Other Ambulatory Visit: Payer: Self-pay | Admitting: Nurse Practitioner

## 2021-09-27 DIAGNOSIS — M545 Low back pain, unspecified: Secondary | ICD-10-CM

## 2021-11-05 ENCOUNTER — Other Ambulatory Visit: Payer: Self-pay | Admitting: Nurse Practitioner

## 2021-11-05 DIAGNOSIS — M545 Low back pain, unspecified: Secondary | ICD-10-CM

## 2021-11-05 NOTE — Telephone Encounter (Signed)
Last office visit 07/23/21 Last refill 09/24/21, #90, no refills

## 2021-11-27 ENCOUNTER — Other Ambulatory Visit: Payer: Self-pay | Admitting: Nurse Practitioner

## 2021-11-27 DIAGNOSIS — M545 Low back pain, unspecified: Secondary | ICD-10-CM

## 2021-12-04 MED ORDER — VIAGRA 100 MG PO TABS: 50.0000 mg | ORAL_TABLET | Freq: Every day | ORAL | 11 refills | Status: DC | PRN

## 2021-12-17 ENCOUNTER — Other Ambulatory Visit: Payer: Self-pay | Admitting: Nurse Practitioner

## 2021-12-17 DIAGNOSIS — G8929 Other chronic pain: Secondary | ICD-10-CM

## 2022-01-12 ENCOUNTER — Other Ambulatory Visit: Payer: Self-pay | Admitting: Nurse Practitioner

## 2022-01-12 DIAGNOSIS — G8929 Other chronic pain: Secondary | ICD-10-CM

## 2022-01-22 ENCOUNTER — Ambulatory Visit (INDEPENDENT_AMBULATORY_CARE_PROVIDER_SITE_OTHER): Payer: 59 | Admitting: Nurse Practitioner

## 2022-01-22 ENCOUNTER — Encounter: Payer: Self-pay | Admitting: Nurse Practitioner

## 2022-01-22 ENCOUNTER — Other Ambulatory Visit (HOSPITAL_COMMUNITY)
Admission: RE | Admit: 2022-01-22 | Discharge: 2022-01-22 | Disposition: A | Discharge status: Home / Self Care | Payer: 59 | Source: Ambulatory Visit | Attending: Nurse Practitioner | Admitting: Nurse Practitioner

## 2022-01-22 VITALS — BP 100/57 | HR 64 | Temp 97.9°F | Resp 20 | Ht 70.0 in | Wt 171.0 lb

## 2022-01-22 DIAGNOSIS — F5101 Primary insomnia: Secondary | ICD-10-CM

## 2022-01-22 DIAGNOSIS — R1900 Intra-abdominal and pelvic swelling, mass and lump, unspecified site: Secondary | ICD-10-CM | POA: Diagnosis present

## 2022-01-22 DIAGNOSIS — G2581 Restless legs syndrome: Secondary | ICD-10-CM

## 2022-01-22 DIAGNOSIS — G8929 Other chronic pain: Secondary | ICD-10-CM

## 2022-01-22 DIAGNOSIS — I10 Essential (primary) hypertension: Secondary | ICD-10-CM | POA: Diagnosis not present

## 2022-01-22 DIAGNOSIS — E782 Mixed hyperlipidemia: Secondary | ICD-10-CM | POA: Diagnosis not present

## 2022-01-22 DIAGNOSIS — F339 Major depressive disorder, recurrent, unspecified: Secondary | ICD-10-CM

## 2022-01-22 DIAGNOSIS — Z0001 Encounter for general adult medical examination with abnormal findings: Secondary | ICD-10-CM | POA: Diagnosis not present

## 2022-01-22 DIAGNOSIS — Z6828 Body mass index (BMI) 28.0-28.9, adult: Secondary | ICD-10-CM

## 2022-01-22 DIAGNOSIS — R609 Edema, unspecified: Secondary | ICD-10-CM

## 2022-01-22 DIAGNOSIS — E039 Hypothyroidism, unspecified: Secondary | ICD-10-CM | POA: Diagnosis not present

## 2022-01-22 DIAGNOSIS — M545 Low back pain, unspecified: Secondary | ICD-10-CM

## 2022-01-22 DIAGNOSIS — Z0000 Encounter for general adult medical examination without abnormal findings: Secondary | ICD-10-CM

## 2022-01-22 MED ORDER — AMLODIPINE BESY-BENAZEPRIL HCL 10-20 MG PO CAPS: ORAL_CAPSULE | ORAL | 1 refills | Status: DC

## 2022-01-22 MED ORDER — PRAMIPEXOLE DIHYDROCHLORIDE 0.5 MG PO TABS: ORAL_TABLET | ORAL | 1 refills | Status: DC

## 2022-01-22 MED ORDER — FUROSEMIDE 20 MG PO TABS: 20.0000 mg | ORAL_TABLET | Freq: Every day | ORAL | 1 refills | Status: DC

## 2022-01-22 NOTE — Progress Notes (Signed)
 Subjective:    Patient ID: Maria [name], female    DOB: 11/02/1970, 51 y.o.   MRN: [id number]   Chief Complaint: annual physical  w PAP  HPI:  Maria [name] is a 51 y.o. who identifies as a female who was assigned female at birth.   Social history: Lives with: husband Work history: currently  not working   Comes in today for follow up of the following chronic medical issues:  1. Annual physical exam Current medical issues as stated below  2. Essential hypertension No c/o chest pain, sob or headaches. Does not check BP at home. BP Readings from Last 3 Encounters:  01/22/22 (!) 100/57  07/23/21 105/68  01/18/21 109/68    3. Mixed hyperlipidemia Does try to watch diet, occasional exercise. Lab Results  Component Value Date   CHOL 205 (H) 07/23/2021   HDL 52 07/23/2021   LDLCALC 120 (H) 07/23/2021   TRIG 186 (H) 07/23/2021   CHOLHDL 3.9 07/23/2021   The 10-year ASCVD risk score (Arnett DK, et al., 2019) is: 4%   4. Acquired hypothyroidism No c/o fatigue Lab Results  Component Value Date   TSH 0.483 01/18/2021   T4TOTAL 7.8 01/18/2021     5. Restless leg Takes mirapex at night, is working well for her.  6. Recurrent major depressive disorder, remission status unspecified (HCC) Currently on no meds; is doing well.    01/22/2022    9:08 AM 07/23/2021   11:44 AM 07/18/2020   10:40 AM 05/02/2020    8:42 AM 01/17/2020   10:34 AM  Depression screen PHQ 2/9  Decreased Interest 0 0 0 0 0  Down, Depressed, Hopeless 0 0 1 0 0  PHQ - 2 Score 0 0 1 0 0  Altered sleeping 3 1 0  0  Tired, decreased energy 0 0 0  0  Change in appetite 3 0 0  0  Feeling bad or failure about yourself  0 0 0  0  Trouble concentrating 0 0 0  0  Moving slowly or fidgety/restless 0 0 0  0  Suicidal thoughts 0 0 0  0  PHQ-9 Score 6 1 1  0  Difficult doing work/chores Somewhat difficult Not difficult at all Not difficult at all  Not difficult at all     7. Primary  insomnia Sleeping ok at night without meds.  8. Chronic midline low back pain without sciatica Doing well since hip replacement.  9. BMI 28.0-28.9,adult Weight is down 26 lbs since last check. Wt Readings from Last 3 Encounters:  01/22/22 171 lb (77.6 kg)  07/23/21 197 lb (89.4 kg)  01/18/21 204 lb 6.4 oz (92.7 kg)    BMI Readings from Last 3 Encounters:  01/22/22 24.54 kg/m  07/23/21 28.27 kg/m  01/18/21 29.33 kg/m      New complaints: Request ortho referral for L ankle pain and swelling  Allergies  Allergen Reactions   Compazine [Prochlorperazine Edisylate]    Sulfa Antibiotics Rash   Outpatient Encounter Medications as of 01/22/2022  Medication Sig   amLODipine-benazepril (LOTREL) 10-20 MG capsule TAKE (1) CAPSULE BY MOUTH EVERY DAY.   estrogen, conjugated,-medroxyprogesterone (PREMPRO) 0.3-1.5 MG tablet Take 1 tablet by mouth daily.   furosemide (LASIX) 20 MG tablet Take 1 tablet (20 mg total) by mouth daily.   gabapentin (NEURONTIN) 400 MG capsule Take 3 capsules (1,200 mg total) by mouth at bedtime.   IBU 800 MG tablet TAKE (1) TABLET BY MOUTH EVERY EIGHT HOURS   AS NEEDED.   Multiple Vitamin (MULTIVITAMIN) tablet Take 1 tablet by mouth daily.   pramipexole (MIRAPEX) 0.5 MG tablet TAKE (1) TABLET BY MOUTH (3) TIMES DAILY.   tiZANidine (ZANAFLEX) 4 MG tablet TAKE 1 TABLET BY MOUTH THREE TIMES DAILY.   traMADol (ULTRAM) 50 MG tablet Take 100 mg by mouth every 6 (six) hours as needed.   VIAGRA 100 MG tablet Take 0.5-1 tablets (50-100 mg total) by mouth daily as needed for erectile dysfunction.   No facility-administered encounter medications on file as of 01/22/2022.    Past Surgical History:  Procedure Laterality Date   BACK SURGERY  2018   BREAST EXCISIONAL BIOPSY Right 1993   BREAST SURGERY     lumpectomy   CESAREAN SECTION     CHOLECYSTECTOMY     ENDOMETRIAL ABLATION     l5-s1 fusion  2008, 2009, 2011   THYROIDECTOMY Right 2001   TOTAL HIP ARTHROPLASTY  Left     Family History  Problem Relation Age of Onset   Breast cancer Mother    Hypertension Mother    Cancer Mother        breast   Hypothyroidism Mother    Hypertension Father    Hypothyroidism Father       Controlled substance contract: n/a     Review of Systems  Constitutional:  Negative for diaphoresis and fatigue.  Eyes:  Negative for pain.  Respiratory:  Negative for chest tightness and shortness of breath.   Cardiovascular:  Negative for chest pain and palpitations.  Gastrointestinal:  Negative for abdominal pain.  Endocrine: Negative for polydipsia and polyphagia.  Genitourinary:  Negative for difficulty urinating.  Musculoskeletal:  Negative for back pain.       L ankle pain and swelling  Neurological:  Negative for dizziness, weakness and headaches.  Hematological:  Negative for adenopathy. Does not bruise/bleed easily.  All other systems reviewed and are negative.      Objective:   Physical Exam Vitals and nursing note reviewed. Exam conducted with a chaperone present.  Constitutional:      General: She is not in acute distress.    Appearance: Normal appearance. She is well-developed. She is not ill-appearing.  HENT:     Head: Normocephalic and atraumatic.     Right Ear: Tympanic membrane, ear canal and external ear normal.     Left Ear: Tympanic membrane, ear canal and external ear normal.     Nose: Nose normal.     Mouth/Throat:     Mouth: Mucous membranes are moist.     Pharynx: Oropharynx is clear.  Eyes:     Extraocular Movements: Extraocular movements intact.     Conjunctiva/sclera: Conjunctivae normal.     Pupils: Pupils are equal, round, and reactive to light.  Neck:     Thyroid: No thyroid tenderness.     Vascular: No carotid bruit or JVD.     Trachea: Phonation normal.  Cardiovascular:     Rate and Rhythm: Normal rate and regular rhythm.     Pulses: Normal pulses.     Heart sounds: Normal heart sounds.  Pulmonary:     Effort:  Pulmonary effort is normal. No respiratory distress.     Breath sounds: Normal breath sounds. No wheezing, rhonchi or rales.  Chest:     Chest wall: No tenderness.  Abdominal:     General: Bowel sounds are normal. There is no distension.     Palpations: Abdomen is soft. There is no mass.       Tenderness: There is no abdominal tenderness. There is no guarding or rebound.  Genitourinary:    General: Normal vulva.     Exam position: Lithotomy position.     Vagina: No vaginal discharge.     Rectum: Normal.     Comments: Cervix parous and pink Left adnexal mass palpable Musculoskeletal:        General: Normal range of motion.     Cervical back: Normal range of motion. No tenderness.     Left ankle: Swelling present.  Lymphadenopathy:     Cervical: No cervical adenopathy.  Skin:    General: Skin is warm and dry.     Capillary Refill: Capillary refill takes less than 2 seconds.  Neurological:     General: No focal deficit present.     Mental Status: She is alert and oriented to person, place, and time.     Cranial Nerves: Cranial nerves 2-12 are intact.     Sensory: Sensation is intact.     Motor: Motor function is intact.     Coordination: Coordination is intact.     Gait: Gait is intact.     Deep Tendon Reflexes: Reflexes are normal and symmetric.  Psychiatric:        Mood and Affect: Mood normal.        Behavior: Behavior normal.     BP (!) 100/57   Pulse 64   Temp 97.9 F (36.6 C) (Temporal)   Resp 20   Ht 5' 10" (1.778 m)   Wt 171 lb (77.6 kg)   SpO2 99%   BMI 24.54 kg/m        Assessment & Plan:   Maria [name] comes in today with chief complaint of Annual Exam   Diagnosis and orders addressed:  1. Annual physical exam   2. Essential hypertension Low sodium diet - amLODipine-benazepril (LOTREL) 10-20 MG capsule; TAKE (1) CAPSULE BY MOUTH EVERY DAY.  Dispense: 90 capsule; Refill: 1 - CBC with Differential/Platelet - CMP14+EGFR  3. Mixed  hyperlipidemia Low fat diet - Lipid panel  4. Acquired hypothyroidism Labs pending - Thyroid Panel With TSH  5. Restless leg Keep legs warm at night - pramipexole (MIRAPEX) 0.5 MG tablet; TAKE (1) TABLET BY MOUTH (3) TIMES DAILY.  Dispense: 270 tablet; Refill: 1  6. Recurrent major depressive disorder, remission status unspecified (HCC) Stress management  7. Primary insomnia Bedtime routine  8. Chronic midline low back pain without sciatica stretches - Arthritis Panel  9. BMI 28.0-28.9,adult Discussed diet and exercise for person with BMI >25 Will recheck weight in 3-6 months   10. Peripheral edema Elevate  legs when sitting - furosemide (LASIX) 20 MG tablet; Take 1 tablet (20 mg total) by mouth daily.  Dispense: 90 tablet; Refill: 1  11. Pelvic mass Will call with results - US Pelvic Complete With Transvaginal; Future - Cytology - PAP   Labs pending Health Maintenance reviewed Diet and exercise encouraged  Follow up plan: 6 months   Mary-Margaret Martin, FNP  

## 2022-01-22 NOTE — Patient Instructions (Signed)

## 2022-01-23 LAB — ARTHRITIS PANEL
Basophils Absolute: 0.1 10*3/uL (ref 0.0–0.2)
Basos: 1 %
EOS (ABSOLUTE): 0.3 10*3/uL (ref 0.0–0.4)
Eos: 5 %
Hematocrit: 35.8 % (ref 34.0–46.6)
Hemoglobin: 12.6 g/dL (ref 11.1–15.9)
Immature Grans (Abs): 0 10*3/uL (ref 0.0–0.1)
Immature Granulocytes: 0 %
Lymphocytes Absolute: 1.9 10*3/uL (ref 0.7–3.1)
Lymphs: 30 %
MCH: 32.8 pg (ref 26.6–33.0)
MCHC: 35.2 g/dL (ref 31.5–35.7)
MCV: 93 fL (ref 79–97)
Monocytes Absolute: 0.4 10*3/uL (ref 0.1–0.9)
Monocytes: 6 %
Neutrophils Absolute: 3.7 10*3/uL (ref 1.4–7.0)
Neutrophils: 58 %
Platelets: 301 10*3/uL (ref 150–450)
RBC: 3.84 x10E6/uL (ref 3.77–5.28)
RDW: 12.1 % (ref 11.7–15.4)
Rheumatoid fact SerPl-aCnc: 11.6 IU/mL (ref <14.0)
Sed Rate: 2 mm/hr (ref 0–40)
Uric Acid: 4.1 mg/dL (ref 3.0–7.2)
WBC: 6.3 10*3/uL (ref 3.4–10.8)

## 2022-01-23 LAB — CMP14+EGFR
ALT: 16 IU/L (ref 0–32)
AST: 21 IU/L (ref 0–40)
Albumin/Globulin Ratio: 2 (ref 1.2–2.2)
Albumin: 4.2 g/dL (ref 3.8–4.9)
Alkaline Phosphatase: 94 IU/L (ref 44–121)
BUN/Creatinine Ratio: 13 (ref 9–23)
BUN: 11 mg/dL (ref 6–24)
Bilirubin Total: 0.3 mg/dL (ref 0.0–1.2)
CO2: 23 mmol/L (ref 20–29)
Calcium: 9.3 mg/dL (ref 8.7–10.2)
Chloride: 105 mmol/L (ref 96–106)
Creatinine, Ser: 0.82 mg/dL (ref 0.57–1.00)
Globulin, Total: 2.1 g/dL (ref 1.5–4.5)
Glucose: 102 mg/dL — ABNORMAL HIGH (ref 70–99)
Potassium: 3.5 mmol/L (ref 3.5–5.2)
Sodium: 142 mmol/L (ref 134–144)
Total Protein: 6.3 g/dL (ref 6.0–8.5)
eGFR: 86 mL/min/{1.73_m2} (ref >59)

## 2022-01-23 LAB — THYROID PANEL WITH TSH
Free Thyroxine Index: 1.7 (ref 1.2–4.9)
T3 Uptake Ratio: 25 % (ref 24–39)
T4, Total: 6.6 ug/dL (ref 4.5–12.0)
TSH: 0.439 u[IU]/mL — ABNORMAL LOW (ref 0.450–4.500)

## 2022-01-23 LAB — LIPID PANEL
Chol/HDL Ratio: 2.8 ratio (ref 0.0–4.4)
Cholesterol, Total: 184 mg/dL (ref 100–199)
HDL: 66 mg/dL (ref >39)
LDL Chol Calc (NIH): 99 mg/dL (ref 0–99)
Triglycerides: 104 mg/dL (ref 0–149)
VLDL Cholesterol Cal: 19 mg/dL (ref 5–40)

## 2022-01-25 LAB — CYTOLOGY - PAP
Adequacy: ABSENT
Diagnosis: NEGATIVE

## 2022-01-30 ENCOUNTER — Ambulatory Visit
Admission: RE | Admit: 2022-01-30 | Discharge: 2022-01-30 | Disposition: A | Discharge status: Home / Self Care | Payer: 59 | Source: Ambulatory Visit | Attending: Anesthesiology | Admitting: Anesthesiology

## 2022-01-30 ENCOUNTER — Other Ambulatory Visit: Payer: Self-pay | Admitting: Anesthesiology

## 2022-01-30 DIAGNOSIS — R52 Pain, unspecified: Secondary | ICD-10-CM

## 2022-01-31 ENCOUNTER — Ambulatory Visit (HOSPITAL_COMMUNITY)
Admission: RE | Admit: 2022-01-31 | Discharge: 2022-01-31 | Disposition: A | Discharge status: Home / Self Care | Payer: 59 | Source: Ambulatory Visit | Attending: Nurse Practitioner | Admitting: Nurse Practitioner

## 2022-01-31 DIAGNOSIS — R1900 Intra-abdominal and pelvic swelling, mass and lump, unspecified site: Secondary | ICD-10-CM | POA: Insufficient documentation

## 2022-02-04 ENCOUNTER — Other Ambulatory Visit: Payer: Self-pay | Admitting: Nurse Practitioner

## 2022-02-04 DIAGNOSIS — M545 Low back pain, unspecified: Secondary | ICD-10-CM

## 2022-02-06 ENCOUNTER — Encounter: Payer: Self-pay | Admitting: *Deleted

## 2022-02-07 ENCOUNTER — Other Ambulatory Visit: Payer: Self-pay | Admitting: Nurse Practitioner

## 2022-02-07 DIAGNOSIS — R102 Pelvic and perineal pain: Secondary | ICD-10-CM

## 2022-02-07 DIAGNOSIS — R1032 Left lower quadrant pain: Secondary | ICD-10-CM

## 2022-02-18 ENCOUNTER — Ambulatory Visit (HOSPITAL_COMMUNITY): Admission: RE | Admit: 2022-02-18 | Payer: 59 | Source: Ambulatory Visit

## 2022-02-23 ENCOUNTER — Other Ambulatory Visit: Payer: Self-pay | Admitting: Nurse Practitioner

## 2022-02-28 ENCOUNTER — Other Ambulatory Visit: Payer: Self-pay | Admitting: Nurse Practitioner

## 2022-03-18 ENCOUNTER — Other Ambulatory Visit: Payer: Self-pay | Admitting: Nurse Practitioner

## 2022-03-18 DIAGNOSIS — G8929 Other chronic pain: Secondary | ICD-10-CM

## 2022-04-22 ENCOUNTER — Other Ambulatory Visit: Payer: Self-pay | Admitting: Nurse Practitioner

## 2022-04-22 DIAGNOSIS — G8929 Other chronic pain: Secondary | ICD-10-CM

## 2022-05-09 ENCOUNTER — Other Ambulatory Visit: Payer: Self-pay | Admitting: Nurse Practitioner

## 2022-05-20 NOTE — Telephone Encounter (Signed)
Ntbs so can look in ear

## 2022-05-22 ENCOUNTER — Other Ambulatory Visit: Payer: Self-pay | Admitting: Nurse Practitioner

## 2022-05-22 DIAGNOSIS — M545 Low back pain, unspecified: Secondary | ICD-10-CM

## 2022-06-07 ENCOUNTER — Other Ambulatory Visit: Payer: Self-pay | Admitting: Nurse Practitioner

## 2022-06-07 DIAGNOSIS — M545 Low back pain, unspecified: Secondary | ICD-10-CM

## 2022-06-20 ENCOUNTER — Other Ambulatory Visit: Payer: Self-pay | Admitting: Nurse Practitioner

## 2022-06-20 DIAGNOSIS — M545 Low back pain, unspecified: Secondary | ICD-10-CM

## 2022-07-19 ENCOUNTER — Other Ambulatory Visit: Payer: Self-pay | Admitting: Nurse Practitioner

## 2022-07-19 DIAGNOSIS — M545 Low back pain, unspecified: Secondary | ICD-10-CM

## 2022-07-25 ENCOUNTER — Ambulatory Visit: Payer: 59 | Admitting: Nurse Practitioner

## 2022-07-30 ENCOUNTER — Ambulatory Visit: Payer: 59 | Admitting: Nurse Practitioner

## 2022-07-30 ENCOUNTER — Encounter: Payer: Self-pay | Admitting: Nurse Practitioner

## 2022-07-30 NOTE — Progress Notes (Deleted)
   Subjective:    Patient ID: Maria [name], female    DOB: 09/11/1970, 52 y.o.   MRN: [id number]   Chief Complaint: medical management of chronic issues     HPI:  Maria [name] is a 52 y.o. who identifies as a female who was assigned female at birth.   Social history: Lives with: husband and son Work history: currently not working   Comes in today for follow up of the following chronic medical issues:  1. Essential hypertension No c/o chest pain, sob or headasche. Does not check blood pressure at home. BP Readings from Last 3 Encounters:  01/22/22 (!) 100/57  07/23/21 105/68  01/18/21 109/68     2. Acquired hypothyroidism No problems that she is aware of. Lab Results  Component Value Date   TSH 0.439 (L) 01/22/2022     3. Mixed hyperlipidemia Does watch diet some what. Does no dedicated exercise Lab Results  Component Value Date   CHOL 184 01/22/2022   HDL 66 01/22/2022   LDLCALC 99 01/22/2022   TRIG 104 01/22/2022   CHOLHDL 2.8 01/22/2022     4. Recurrent major depressive disorder, remission status unspecified (HCC) Currently on no antidepressants  5. Primary insomnia Has been sleeping well with no medication.  6. Restless leg 7. Chronic midline low back pain without sciatica Takes neurontin daily which seems  to help.she also takes zanaflex as needed. Rates back pain ***  8. BMI 28.0-28.9,adult No recent weight changes   New complaints: ***  Allergies  Allergen Reactions   Compazine [Prochlorperazine Edisylate]    Sulfa Antibiotics Rash   Outpatient Encounter Medications as of 07/30/2022  Medication Sig   amLODipine-benazepril (LOTREL) 10-20 MG capsule TAKE (1) CAPSULE BY MOUTH EVERY DAY.   estrogen, conjugated,-medroxyprogesterone (PREMPRO) 0.3-1.5 MG tablet TAKE (1) TABLET BY MOUTH ONCE DAILY.   furosemide (LASIX) 20 MG tablet Take 1 tablet (20 mg total) by mouth daily.   gabapentin (NEURONTIN) 400 MG capsule TAKE (3) CAPSULES BY MOUTH  AT BEDTIME.   HYDROcodone-acetaminophen (NORCO) 7.5-325 MG tablet Take 1 tablet by mouth 4 (four) times daily as needed.   ibuprofen (IBU) 800 MG tablet TAKE (1) TABLET BY MOUTH EVERY EIGHT HOURS AS NEEDED.   Multiple Vitamin (MULTIVITAMIN) tablet Take 1 tablet by mouth daily.   pramipexole (MIRAPEX) 0.5 MG tablet TAKE (1) TABLET BY MOUTH (3) TIMES DAILY.   tiZANidine (ZANAFLEX) 4 MG tablet TAKE 1 TABLET BY MOUTH THREE TIMES DAILY.   No facility-administered encounter medications on file as of 07/30/2022.    Past Surgical History:  Procedure Laterality Date   BACK SURGERY  2018   BREAST EXCISIONAL BIOPSY Right 1993   BREAST SURGERY     lumpectomy   CESAREAN SECTION     CHOLECYSTECTOMY     ENDOMETRIAL ABLATION     l5-s1 fusion  2008, 2009, 2011   THYROIDECTOMY Right 2001   TOTAL HIP ARTHROPLASTY Left     Family History  Problem Relation Age of Onset   Breast cancer Mother    Hypertension Mother    Cancer Mother        breast   Hypothyroidism Mother    Hypertension Father    Hypothyroidism Father       Controlled substance contract: ***     Review of Systems     Objective:   Physical Exam        Assessment & Plan:

## 2022-08-16 ENCOUNTER — Ambulatory Visit: Payer: 59 | Admitting: Nurse Practitioner

## 2022-08-16 ENCOUNTER — Encounter: Payer: Self-pay | Admitting: Nurse Practitioner

## 2022-08-16 VITALS — BP 156/94 | HR 69 | Temp 97.5°F | Resp 20 | Ht 70.0 in | Wt 166.0 lb

## 2022-08-16 DIAGNOSIS — F339 Major depressive disorder, recurrent, unspecified: Secondary | ICD-10-CM | POA: Diagnosis not present

## 2022-08-16 DIAGNOSIS — R2242 Localized swelling, mass and lump, left lower limb: Secondary | ICD-10-CM

## 2022-08-16 DIAGNOSIS — M25552 Pain in left hip: Secondary | ICD-10-CM

## 2022-08-16 MED ORDER — ESCITALOPRAM OXALATE 20 MG PO TABS: 20.0000 mg | ORAL_TABLET | Freq: Every day | ORAL | 5 refills | Status: DC

## 2022-08-16 NOTE — Progress Notes (Signed)
Subjective:    Patient ID: Maria [name], female    DOB: [birth year], 52 y.o.   MRN: [id number]   Chief Complaint: patient comes in with multiple issues  HPI Patient comes in c/o: -Patient in c/o depression. Her daughte ris getting married and moving out and patient is really stressing over it. Her daughter also recently lost a pregnancy and that has been stressful. SHe is having flash backs from childhood. Not eating well. Having martial issues. Not sleeping well and very irritable    08/16/2022    3:22 PM 01/22/2022    9:08 AM 07/23/2021   11:44 AM  Depression screen PHQ 2/9  Decreased Interest 2 0 0  Down, Depressed, Hopeless 2 0 0  PHQ - 2 Score 4 0 0  Altered sleeping 3 3 1   Tired, decreased energy 1 0 0  Change in appetite 3 3 0  Feeling bad or failure about yourself  3 0 0  Trouble concentrating 3 0 0  Moving slowly or fidgety/restless 0 0 0  Suicidal thoughts 1 0 0  PHQ-9 Score 18 6 1   Difficult doing work/chores Somewhat difficult Somewhat difficult Not difficult at all   - Left hip pain. Started several years ago since had hip replacement. Has a tear in muscle. She is now having  to walk with a cane. Rates pain 6-8/0 constantly. Worse with walking or sitting for long periodsof time. Dr. Vear Clock is giving her pain meds.  - nodule right lower leg- dr. Vear Clock want MRI ordered Patient Active Problem List   Diagnosis Date Noted   BMI 28.0-28.9,adult 07/23/2021   Restless leg 07/09/2019   Annual physical exam 02/12/2018   Essential hypertension 06/02/2014   Thyroid nodule, left 08/31/2013   Hypothyroidism 08/10/2012   Hyperlipidemia 08/10/2012   Depression 08/10/2012   Chronic back pain 08/10/2012   Insomnia 08/10/2012       Review of Systems  Constitutional:  Negative for diaphoresis.  Eyes:  Negative for pain.  Respiratory:  Negative for shortness of breath.   Cardiovascular:  Negative for chest pain, palpitations and leg swelling.  Gastrointestinal:   Negative for abdominal pain.  Endocrine: Negative for polydipsia.  Musculoskeletal:  Positive for arthralgias (left hip).  Skin:  Negative for rash.  Neurological:  Negative for dizziness, weakness and headaches.  Hematological:  Does not bruise/bleed easily.  All other systems reviewed and are negative.      Objective:   Physical Exam Constitutional:      Appearance: Normal appearance.  Cardiovascular:     Rate and Rhythm: Normal rate and regular rhythm.     Heart sounds: Normal heart sounds.  Pulmonary:     Effort: Pulmonary effort is normal.     Breath sounds: Normal breath sounds.  Skin:    General: Skin is warm.  Neurological:     General: No focal deficit present.     Mental Status: She is alert and oriented to person, place, and time.  Psychiatric:        Mood and Affect: Mood normal.        Behavior: Behavior normal.     BP (!) 156/94   Pulse 69   Temp (!) 97.5 F (36.4 C) (Temporal)   Resp 20   Ht 5\' 10"  (1.778 m)   Wt 166 lb (75.3 kg)   SpO2 100%   BMI 23.82 kg/m        Assessment & Plan:  Maria [name] in today with chief  complaint of multiple issues   1. Left hip pain Keep follow up with Dr Vear Clock  2. Depression, recurrent (HCC) Stress management - escitalopram (LEXAPRO) 20 MG tablet; Take 1 tablet (20 mg total) by mouth daily.  Dispense: 30 tablet; Refill: 5  3. Subcutaneous nodule of left lower extremity Will talk once MRI results are in. - MR TIBIA FIBULA RIGHT W CONTRAST; Future    The above assessment and management plan was discussed with the patient. The patient verbalized understanding of and has agreed to the management plan. Patient is aware to call the clinic if symptoms persist or worsen. Patient is aware when to return to the clinic for a follow-up visit. Patient educated on when it is appropriate to go to the emergency department.   Mary-Margaret Daphine Deutscher, FNP

## 2022-08-16 NOTE — Patient Instructions (Signed)
Managing Depression, Adult Depression is a mental health condition that affects your thoughts, feelings, and actions. Being diagnosed with depression can bring you relief if you did not know why you have felt or behaved a certain way. It could also leave you feeling overwhelmed. Finding ways to manage your symptoms can help you feel more positive about your future. How to manage lifestyle changes Being depressed is difficult. Depression can increase the level of everyday stress. Stress can make depression symptoms worse. You may believe your symptoms cannot be managed or will never improve. However, there are many things you can try to help manage your symptoms. There is hope. Managing stress  Stress is your body's reaction to life changes and events, both good and bad. Stress can add to your feelings of depression. Learning to manage your stress can help lessen your feelings of depression. Try some of the following approaches to reducing your stress (stress reduction techniques): Listen to music that you enjoy and that inspires you. Try using a meditation app or take a meditation class. Develop a practice that helps you connect with your spiritual self. Walk in nature, pray, or go to a place of worship. Practice deep breathing. To do this, inhale slowly through your nose. Pause at the top of your inhale for a few seconds and then exhale slowly, letting yourself relax. Repeat this three or four times. Practice yoga to help relax and work your muscles. Choose a stress reduction technique that works for you. These techniques take time and practice to develop. Set aside 5-15 minutes a day to do them. Therapists can offer training in these techniques. Do these things to help manage stress: Keep a journal. Know your limits. Set healthy boundaries for yourself and others, such as saying "no" when you think something is too much. Pay attention to how you react to certain situations. You may not be able to  control everything, but you can change your reaction. Add humor to your life by watching funny movies or shows. Make time for activities that you enjoy and that relax you. Spend less time using electronics, especially at night before bed. The light from screens can make your brain think it is time to get up rather than go to bed.  Medicines Medicines, such as antidepressants, are often a part of treatment for depression. Talk with your pharmacist or health care provider about all the medicines, supplements, and herbal products that you take, their possible side effects, and what medicines and other products are safe to take together. Make sure to report any side effects you may have to your health care provider. Relationships Your health care provider may suggest family therapy, couples therapy, or individual therapy as part of your treatment. How to recognize changes Everyone responds differently to treatment for depression. As you recover from depression, you may start to: Have more interest in doing activities. Feel more hopeful. Have more energy. Eat a more regular amount of food. Have better mental focus. It is important to recognize if your depression is not getting better or is getting worse. The symptoms you had in the beginning may return, such as: Feeling tired. Eating too much or too little. Sleeping too much or too little. Feeling restless, agitated, or hopeless. Trouble focusing or making decisions. Having unexplained aches and pains. Feeling irritable, angry, or aggressive. If you or your family members notice these symptoms coming back, let your health care provider know right away. Follow these instructions at home: Activity Try to   get some form of exercise each day, such as walking. Try yoga, mindfulness, or other stress reduction techniques. Participate in group activities if you are able. Lifestyle Get enough sleep. Cut down on or stop using caffeine, tobacco,  alcohol, and any other harmful substances. Eat a healthy diet that includes plenty of vegetables, fruits, whole grains, low-fat dairy products, and lean protein. Limit foods that are high in solid fats, added sugar, or salt (sodium). General instructions Take over-the-counter and prescription medicines only as told by your health care provider. Keep all follow-up visits. It is important for your health care provider to check on your mood, behavior, and medicines. Your health care provider may need to make changes to your treatment. Where to find support Talking to others  Friends and family members can be sources of support and guidance. Talk to trusted friends or family members about your condition. Explain your symptoms and let them know that you are working with a health care provider to treat your depression. Tell friends and family how they can help. Finances Find mental health providers that fit with your financial situation. Talk with your health care provider if you are worried about access to food, housing, or medicine. Call your insurance company to learn about your co-pays and prescription plan. Where to find more information You can find support in your area from: Anxiety and Depression Association of America (ADAA): adaa.org Mental Health America: mentalhealthamerica.net National Alliance on Mental Illness: nami.org Contact a health care provider if: You stop taking your antidepressant medicines, and you have any of these symptoms: Nausea. Headache. Light-headedness. Chills and body aches. Not being able to sleep (insomnia). You or your friends and family think your depression is getting worse. Get help right away if: You have thoughts of hurting yourself or others. Get help right away if you feel like you may hurt yourself or others, or have thoughts about taking your own life. Go to your nearest emergency room or: Call 911. Call the National Suicide Prevention Lifeline at  1-800-273-8255 or 988. This is open 24 hours a day. Text the Crisis Text Line at 741741. This information is not intended to replace advice given to you by your health care provider. Make sure you discuss any questions you have with your health care provider. Document Revised: 07/24/2021 Document Reviewed: 07/24/2021 Elsevier Patient Education  2023 Elsevier Inc.  

## 2022-08-22 ENCOUNTER — Other Ambulatory Visit: Payer: Self-pay

## 2022-08-22 DIAGNOSIS — M25552 Pain in left hip: Secondary | ICD-10-CM

## 2022-08-23 ENCOUNTER — Other Ambulatory Visit: Payer: Self-pay | Admitting: Nurse Practitioner

## 2022-08-23 DIAGNOSIS — G2581 Restless legs syndrome: Secondary | ICD-10-CM

## 2022-08-23 DIAGNOSIS — G8929 Other chronic pain: Secondary | ICD-10-CM

## 2022-08-27 ENCOUNTER — Other Ambulatory Visit: Payer: Self-pay | Admitting: Nurse Practitioner

## 2022-08-27 DIAGNOSIS — M25551 Pain in right hip: Secondary | ICD-10-CM

## 2022-08-29 ENCOUNTER — Other Ambulatory Visit: Payer: Self-pay | Admitting: Nurse Practitioner

## 2022-08-29 DIAGNOSIS — R2241 Localized swelling, mass and lump, right lower limb: Secondary | ICD-10-CM

## 2022-09-04 ENCOUNTER — Telehealth: Payer: Self-pay

## 2022-09-04 NOTE — Transitions of Care (Post Inpatient/ED Visit) (Signed)
   09/04/2022  Name: Maria [name] MRN: [id number] DOB: [birth year]  Today's TOC FU Call Status: Today's TOC FU Call Status:: Unsuccessul Call (1st Attempt) Unsuccessful Call (1st Attempt) Date: 09/04/22  Attempted to reach the patient regarding the most recent Inpatient/ED visit.  Follow Up Plan: Additional outreach attempts will be made to reach the patient to complete the Transitions of Care (Post Inpatient/ED visit) call.   Signature Karena Addison, LPN [facility] Nurse Health Advisor Direct Dial [phone]

## 2022-09-05 ENCOUNTER — Other Ambulatory Visit: Payer: Self-pay | Admitting: Nurse Practitioner

## 2022-09-05 DIAGNOSIS — I10 Essential (primary) hypertension: Secondary | ICD-10-CM

## 2022-09-05 DIAGNOSIS — R600 Localized edema: Secondary | ICD-10-CM

## 2022-09-05 DIAGNOSIS — F339 Major depressive disorder, recurrent, unspecified: Secondary | ICD-10-CM

## 2022-09-05 MED ORDER — AMLODIPINE BESY-BENAZEPRIL HCL 10-20 MG PO CAPS
ORAL_CAPSULE | ORAL | 1 refills | Status: AC
Start: 2022-09-05 — End: ?

## 2022-09-05 MED ORDER — FUROSEMIDE 20 MG PO TABS: 20.0000 mg | ORAL_TABLET | Freq: Every day | ORAL | 1 refills | Status: DC

## 2022-09-06 ENCOUNTER — Ambulatory Visit (HOSPITAL_COMMUNITY)
Admission: RE | Admit: 2022-09-06 | Discharge: 2022-09-06 | Disposition: A | Discharge status: Home / Self Care | Payer: 59 | Source: Ambulatory Visit | Attending: Nurse Practitioner | Admitting: Nurse Practitioner

## 2022-09-06 DIAGNOSIS — R2241 Localized swelling, mass and lump, right lower limb: Secondary | ICD-10-CM | POA: Insufficient documentation

## 2022-09-06 MED ORDER — GADOBUTROL 1 MMOL/ML IV SOLN
7.5000 mL | Freq: Once | INTRAVENOUS | Status: AC | PRN
  Administered 2022-09-06: 7.5 mL via INTRAVENOUS

## 2022-09-06 NOTE — Transitions of Care (Post Inpatient/ED Visit) (Signed)
   09/06/2022  Name: Maria [name] MRN: [id number] DOB: [birth year]  Today's TOC FU Call Status: Today's TOC FU Call Status:: Unsuccessful Call (2nd Attempt) Unsuccessful Call (1st Attempt) Date: 09/04/22 Unsuccessful Call (2nd Attempt) Date: 09/06/22  Attempted to reach the patient regarding the most recent Inpatient/ED visit.  Follow Up Plan: No further outreach attempts will be made at this time. We have been unable to contact the patient.  Signature Karena Addison, LPN [facility] Nurse Health Advisor Direct Dial [phone]

## 2022-09-07 ENCOUNTER — Other Ambulatory Visit: Payer: Self-pay | Admitting: Nurse Practitioner

## 2022-09-07 DIAGNOSIS — M545 Low back pain, unspecified: Secondary | ICD-10-CM

## 2022-09-20 ENCOUNTER — Ambulatory Visit (HOSPITAL_COMMUNITY): Payer: 59

## 2022-09-20 ENCOUNTER — Encounter (HOSPITAL_COMMUNITY): Payer: Self-pay

## 2022-10-12 ENCOUNTER — Other Ambulatory Visit: Payer: Self-pay | Admitting: Nurse Practitioner

## 2022-10-12 DIAGNOSIS — M545 Low back pain, unspecified: Secondary | ICD-10-CM

## 2022-10-12 DIAGNOSIS — G2581 Restless legs syndrome: Secondary | ICD-10-CM

## 2022-11-11 ENCOUNTER — Other Ambulatory Visit: Payer: Self-pay | Admitting: Nurse Practitioner

## 2022-11-11 DIAGNOSIS — M545 Low back pain, unspecified: Secondary | ICD-10-CM

## 2022-11-11 DIAGNOSIS — G8929 Other chronic pain: Secondary | ICD-10-CM

## 2022-11-18 ENCOUNTER — Encounter: Payer: Self-pay | Admitting: Nurse Practitioner

## 2022-11-18 ENCOUNTER — Ambulatory Visit: Payer: 59 | Admitting: Nurse Practitioner

## 2022-11-18 NOTE — Progress Notes (Deleted)
   Subjective:    Patient ID: Maria [name], female    DOB: [birth year], 52 y.o.   MRN: [id number]   Chief Complaint: medical management of chronic issues     HPI:  Maria [name] is a 52 y.o. who identifies as a female who was assigned female at birth.   Social history: Lives with: husband and son Work history: currently not working   Comes in today for follow up of the following chronic medical issues:  1. Essential hypertension No c/o chest pain, sob or headache. Does not check blood pressure at home. BP Readings from Last 3 Encounters:  08/16/22 (!) 156/94  01/22/22 (!) 100/57  07/23/21 105/68     2. Acquired hypothyroidism No issues that she is aware of Lab Results  Component Value Date   TSH 0.439 (L) 01/22/2022     3. Mixed hyperlipidemia Does not watch diet and does no dedicated exercise.  4. Recurrent major depressive disorder, remission status unspecified (HCC) Has been on lexapro for awhile. ***  5. Restless leg Is on neurontin which is helping to keep legs calm  6. Chronic midline low back pain without sciatica Patient sees pain management for her back pain.  7. Primary insomnia Is currently on no sleep aids. Sleep ***  8. BMI 28.0-28.9,adult No recent weight changes ***   New complaints: ***  Allergies  Allergen Reactions   Compazine [Prochlorperazine Edisylate]    Sulfa Antibiotics Rash   Outpatient Encounter Medications as of 11/18/2022  Medication Sig   amLODipine-benazepril (LOTREL) 10-20 MG capsule TAKE (1) CAPSULE BY MOUTH EVERY DAY.   escitalopram (LEXAPRO) 20 MG tablet Take 1 tablet (20 mg total) by mouth daily.   furosemide (LASIX) 20 MG tablet Take 1 tablet (20 mg total) by mouth daily.   gabapentin (NEURONTIN) 400 MG capsule TAKE (3) CAPSULES BY MOUTH AT BEDTIME.   HYDROcodone-acetaminophen (NORCO) 7.5-325 MG tablet Take 1 tablet by mouth 4 (four) times daily as needed.   IBU 800 MG tablet TAKE (1) TABLET BY MOUTH EVERY  EIGHT HOURS AS NEEDED.   Multiple Vitamin (MULTIVITAMIN) tablet Take 1 tablet by mouth daily.   pramipexole (MIRAPEX) 0.5 MG tablet TAKE (1) TABLET BY MOUTH (3) TIMES DAILY.   PREMPRO 0.3-1.5 MG tablet TAKE (1) TABLET BY MOUTH ONCE DAILY.   tiZANidine (ZANAFLEX) 4 MG tablet TAKE 1 TABLET BY MOUTH THREE TIMES DAILY.   No facility-administered encounter medications on file as of 11/18/2022.    Past Surgical History:  Procedure Laterality Date   BACK SURGERY  2018   BREAST EXCISIONAL BIOPSY Right 1993   BREAST SURGERY     lumpectomy   CESAREAN SECTION     CHOLECYSTECTOMY     ENDOMETRIAL ABLATION     l5-s1 fusion  2008, 2009, 2011   THYROIDECTOMY Right 2001   TOTAL HIP ARTHROPLASTY Left     Family History  Problem Relation Age of Onset   Breast cancer Mother    Hypertension Mother    Cancer Mother        breast   Hypothyroidism Mother    Hypertension Father    Hypothyroidism Father       Controlled substance contract: ***     Review of Systems     Objective:   Physical Exam        Assessment & Plan:

## 2022-12-04 ENCOUNTER — Other Ambulatory Visit: Payer: Self-pay | Admitting: Nurse Practitioner

## 2022-12-04 DIAGNOSIS — G2581 Restless legs syndrome: Secondary | ICD-10-CM

## 2022-12-17 ENCOUNTER — Other Ambulatory Visit: Payer: Self-pay | Admitting: Nurse Practitioner

## 2022-12-23 ENCOUNTER — Other Ambulatory Visit: Payer: Self-pay | Admitting: Nurse Practitioner

## 2022-12-23 DIAGNOSIS — G2581 Restless legs syndrome: Secondary | ICD-10-CM

## 2022-12-23 DIAGNOSIS — M545 Other chronic pain: Secondary | ICD-10-CM

## 2023-01-02 ENCOUNTER — Encounter: Payer: Self-pay | Admitting: Nurse Practitioner

## 2023-01-02 ENCOUNTER — Ambulatory Visit: Payer: 59 | Admitting: Nurse Practitioner

## 2023-01-02 VITALS — BP 134/88 | HR 83 | Temp 98.1°F | Resp 20 | Ht 70.0 in | Wt 164.0 lb

## 2023-01-02 DIAGNOSIS — E041 Nontoxic single thyroid nodule: Secondary | ICD-10-CM | POA: Diagnosis not present

## 2023-01-02 DIAGNOSIS — E876 Hypokalemia: Secondary | ICD-10-CM

## 2023-01-02 DIAGNOSIS — R600 Localized edema: Secondary | ICD-10-CM

## 2023-01-02 DIAGNOSIS — Z6828 Body mass index (BMI) 28.0-28.9, adult: Secondary | ICD-10-CM

## 2023-01-02 DIAGNOSIS — E039 Hypothyroidism, unspecified: Secondary | ICD-10-CM | POA: Diagnosis not present

## 2023-01-02 DIAGNOSIS — F5101 Primary insomnia: Secondary | ICD-10-CM

## 2023-01-02 DIAGNOSIS — M545 Low back pain, unspecified: Secondary | ICD-10-CM

## 2023-01-02 DIAGNOSIS — G2581 Restless legs syndrome: Secondary | ICD-10-CM

## 2023-01-02 DIAGNOSIS — I10 Essential (primary) hypertension: Secondary | ICD-10-CM | POA: Diagnosis not present

## 2023-01-02 DIAGNOSIS — E782 Mixed hyperlipidemia: Secondary | ICD-10-CM

## 2023-01-02 DIAGNOSIS — F339 Major depressive disorder, recurrent, unspecified: Secondary | ICD-10-CM

## 2023-01-02 DIAGNOSIS — G8929 Other chronic pain: Secondary | ICD-10-CM

## 2023-01-02 MED ORDER — ESCITALOPRAM OXALATE 20 MG PO TABS
20.0000 mg | ORAL_TABLET | Freq: Every day | ORAL | 5 refills | Status: DC
Start: 2023-01-02 — End: 2023-06-27

## 2023-01-02 MED ORDER — AMLODIPINE BESY-BENAZEPRIL HCL 10-20 MG PO CAPS: ORAL_CAPSULE | ORAL | 1 refills | Status: DC

## 2023-01-02 MED ORDER — GABAPENTIN 400 MG PO CAPS
ORAL_CAPSULE | ORAL | 5 refills | Status: DC
Start: 2023-01-02 — End: 2023-11-14

## 2023-01-02 MED ORDER — FUROSEMIDE 20 MG PO TABS: 20.0000 mg | ORAL_TABLET | Freq: Every day | ORAL | 1 refills | Status: DC

## 2023-01-02 MED ORDER — PRAMIPEXOLE DIHYDROCHLORIDE 0.5 MG PO TABS: ORAL_TABLET | ORAL | 1 refills | Status: DC

## 2023-01-02 NOTE — Patient Instructions (Signed)

## 2023-01-02 NOTE — Progress Notes (Addendum)
 Subjective:    Patient ID: Maria [name], female    DOB: Jun 23, 1970, 52 y.o.   MRN: [id number]   Chief Complaint: Medical Management of Chronic Issues    HPI:  Maria [name] is a 52 y.o. who identifies as a female who was assigned female at birth.   Social history: Lives with: husband and kids Work history: currently not working   Comes in today for follow up of the following chronic medical issues:  1. Essential hypertension No c/o chest pain, sob or headache. Does not check blood pressure at home. BP Readings from Last 3 Encounters:  01/02/23 134/88  08/16/22 (!) 156/94  01/22/22 (!) 100/57     2. Mixed hyperlipidemia Does watch diet and does occasional exercise. Lab Results  Component Value Date   CHOL 184 01/22/2022   HDL 66 01/22/2022   LDLCALC 99 01/22/2022   TRIG 104 01/22/2022   CHOLHDL 2.8 01/22/2022     3. Acquired hypothyroidism No issues that aware of Lab Results  Component Value Date   TSH 0.439 (L) 01/22/2022   T4TOTAL 6.6 01/22/2022     4. Thyroid nodule, left Was tested and there was no concerns. No change in size  5. Recurrent major depressive disorder, remission status unspecified (HCC) Is on lexapro and is doing well.    01/02/2023    2:01 PM 08/16/2022    3:22 PM 01/22/2022    9:08 AM  Depression screen PHQ 2/9  Decreased Interest 0 2 0  Down, Depressed, Hopeless 0 2 0  PHQ - 2 Score 0 4 0  Altered sleeping 1 3 3   Tired, decreased energy 1 1 0  Change in appetite 1 3 3   Feeling bad or failure about yourself  0 3 0  Trouble concentrating 0 3 0  Moving slowly or fidgety/restless 0 0 0  Suicidal thoughts 0 1 0  PHQ-9 Score 3 18 6   Difficult doing work/chores Not difficult at all Somewhat difficult Somewhat difficult     6. Primary insomnia No issues sleeping and is doing weel  7. Restless leg Is on neurontin and is doing well  8. Chronic midline low back pain without sciatica Has chronic back pain and sees pain  management  9. BMI 28.0-28.9,adult Wt Readings from Last 3 Encounters:  01/02/23 164 lb (74.4 kg)  08/16/22 166 lb (75.3 kg)  01/22/22 171 lb (77.6 kg)   BMI Readings from Last 3 Encounters:  01/02/23 23.53 kg/m  08/16/22 23.82 kg/m  01/22/22 24.54 kg/m       New complaints: Menopausal and is on prempro for hot flashes-   Allergies  Allergen Reactions   Compazine [Prochlorperazine Edisylate]    Sulfa Antibiotics Rash   Outpatient Encounter Medications as of 01/02/2023  Medication Sig   amLODipine-benazepril (LOTREL) 10-20 MG capsule TAKE (1) CAPSULE BY MOUTH EVERY DAY.   escitalopram (LEXAPRO) 20 MG tablet Take 1 tablet (20 mg total) by mouth daily.   estrogen, conjugated,-medroxyprogesterone (PREMPRO) 0.3-1.5 MG tablet TAKE (1) TABLET BY MOUTH ONCE DAILY.   furosemide (LASIX) 20 MG tablet Take 1 tablet (20 mg total) by mouth daily.   gabapentin (NEURONTIN) 400 MG capsule TAKE (3) CAPSULES BY MOUTH AT BEDTIME.   HYDROcodone-acetaminophen (NORCO) 7.5-325 MG tablet Take 1 tablet by mouth 4 (four) times daily as needed.   IBU 800 MG tablet TAKE (1) TABLET BY MOUTH EVERY EIGHT HOURS AS NEEDED.   Multiple Vitamin (MULTIVITAMIN) tablet Take 1 tablet by mouth daily.  pramipexole (MIRAPEX) 0.5 MG tablet TAKE (1) TABLET BY MOUTH (3) TIMES DAILY.   tiZANidine (ZANAFLEX) 4 MG tablet TAKE 1 TABLET BY MOUTH THREE TIMES DAILY.   No facility-administered encounter medications on file as of 01/02/2023.    Past Surgical History:  Procedure Laterality Date   BACK SURGERY  2018   BREAST EXCISIONAL BIOPSY Right 1993   BREAST SURGERY     lumpectomy   CESAREAN SECTION     CHOLECYSTECTOMY     ENDOMETRIAL ABLATION     l5-s1 fusion  2008, 2009, 2011   THYROIDECTOMY Right 2001   TOTAL HIP ARTHROPLASTY Left     Family History  Problem Relation Age of Onset   Breast cancer Mother    Hypertension Mother    Cancer Mother        breast   Hypothyroidism Mother    Hypertension Father     Hypothyroidism Father       Controlled substance contract: n/a     Review of Systems  Constitutional:  Negative for diaphoresis.  Eyes:  Negative for pain.  Respiratory:  Negative for shortness of breath.   Cardiovascular:  Negative for chest pain, palpitations and leg swelling.  Gastrointestinal:  Negative for abdominal pain.  Endocrine: Negative for polydipsia.  Skin:  Negative for rash.  Neurological:  Negative for dizziness, weakness and headaches.  Hematological:  Does not bruise/bleed easily.  All other systems reviewed and are negative.      Objective:   Physical Exam Vitals and nursing note reviewed.  Constitutional:      General: She is not in acute distress.    Appearance: Normal appearance. She is well-developed.  HENT:     Head: Normocephalic.     Right Ear: Tympanic membrane normal.     Left Ear: Tympanic membrane normal.     Nose: Nose normal.     Mouth/Throat:     Mouth: Mucous membranes are moist.  Eyes:     Pupils: Pupils are equal, round, and reactive to light.  Neck:     Vascular: No carotid bruit or JVD.  Cardiovascular:     Rate and Rhythm: Normal rate and regular rhythm.     Heart sounds: Normal heart sounds.  Pulmonary:     Effort: Pulmonary effort is normal. No respiratory distress.     Breath sounds: Normal breath sounds. No wheezing or rales.  Chest:     Chest wall: No tenderness.  Abdominal:     General: Bowel sounds are normal. There is no distension or abdominal bruit.     Palpations: Abdomen is soft. There is no hepatomegaly, splenomegaly, mass or pulsatile mass.     Tenderness: There is no abdominal tenderness.  Musculoskeletal:        General: Normal range of motion.     Cervical back: Normal range of motion and neck supple.  Lymphadenopathy:     Cervical: No cervical adenopathy.  Skin:    General: Skin is warm and dry.  Neurological:     Mental Status: She is alert and oriented to person, place, and time.     Deep  Tendon Reflexes: Reflexes are normal and symmetric.  Psychiatric:        Behavior: Behavior normal.        Thought Content: Thought content normal.        Judgment: Judgment normal.     BP 134/88   Pulse 83   Temp 98.1 F (36.7 C) (Temporal)   Resp 20  Ht 5\' 10"  (1.778 m)   Wt 164 lb (74.4 kg)   SpO2 98%   BMI 23.53 kg/m        Assessment & Plan:  Maria [name] comes in today with chief complaint of Medical Management of Chronic Issues   Diagnosis and orders addressed:  1. Essential hypertension Low sodium diet - amLODipine-benazepril (LOTREL) 10-20 MG capsule; TAKE (1) CAPSULE BY MOUTH EVERY DAY.  Dispense: 90 capsule; Refill: 1 - CBC with Differential/Platelet - CMP14+EGFR  2. Mixed hyperlipidemia Low fat diet - Lipid panel  3. Acquired hypothyroidism Labs pending - Thyroid Panel With TSH  4. Thyroid nodule, left Will watch   5. Recurrent major depressive disorder, remission status unspecified (HCC) Stress manegement - escitalopram (LEXAPRO) 20 MG tablet; Take 1 tablet (20 mg total) by mouth daily.  Dispense: 30 tablet; Refill: 5  6. Primary insomnia Bedtime routine  7. Restless leg Keep legs warm at night - pramipexole (MIRAPEX) 0.5 MG tablet; TAKE (1) TABLET BY MOUTH (3) TIMES DAILY.  Dispense: 90 tablet; Refill: 1  8. Chronic midline low back pain without sciatica Moist heat - gabapentin (NEURONTIN) 400 MG capsule; TAKE (3) CAPSULES BY MOUTH AT BEDTIME.  Dispense: 90 capsule; Refill: 5  9. BMI 28.0-28.9,adult Discussed diet and exercise for person with BMI >25 Will recheck weight in 3-6 months   10. Peripheral edema Elevate legs when sitting - furosemide (LASIX) 20 MG tablet; Take 1 tablet (20 mg total) by mouth daily.  Dispense: 90 tablet; Refill: 1  11. Menopausal Continue prempro as prescribed  Labs pending Health Maintenance reviewed Diet and exercise encouraged  Follow up plan: 6 months   Mary-Margaret Daphine Deutscher, FNP

## 2023-01-03 ENCOUNTER — Other Ambulatory Visit: Payer: Self-pay | Admitting: Nurse Practitioner

## 2023-01-03 DIAGNOSIS — E0590 Thyrotoxicosis, unspecified without thyrotoxic crisis or storm: Secondary | ICD-10-CM

## 2023-01-03 LAB — CMP14+EGFR
ALT: 34 [IU]/L — ABNORMAL HIGH (ref 0–32)
AST: 18 [IU]/L (ref 0–40)
Albumin: 4.8 g/dL (ref 3.8–4.9)
Alkaline Phosphatase: 111 [IU]/L (ref 44–121)
BUN/Creatinine Ratio: 18 (ref 9–23)
BUN: 14 mg/dL (ref 6–24)
Bilirubin Total: 0.5 mg/dL (ref 0.0–1.2)
CO2: 25 mmol/L (ref 20–29)
Calcium: 10.2 mg/dL (ref 8.7–10.2)
Chloride: 98 mmol/L (ref 96–106)
Creatinine, Ser: 0.76 mg/dL (ref 0.57–1.00)
Globulin, Total: 2.5 g/dL (ref 1.5–4.5)
Glucose: 106 mg/dL — ABNORMAL HIGH (ref 70–99)
Potassium: 2.9 mmol/L — ABNORMAL LOW (ref 3.5–5.2)
Sodium: 140 mmol/L (ref 134–144)
Total Protein: 7.3 g/dL (ref 6.0–8.5)
eGFR: 94 mL/min/{1.73_m2} (ref >59)

## 2023-01-03 LAB — CBC WITH DIFFERENTIAL/PLATELET
Basophils Absolute: 0.1 10*3/uL (ref 0.0–0.2)
Basos: 1 %
EOS (ABSOLUTE): 0.2 10*3/uL (ref 0.0–0.4)
Eos: 3 %
Hematocrit: 42.7 % (ref 34.0–46.6)
Hemoglobin: 14.4 g/dL (ref 11.1–15.9)
Immature Grans (Abs): 0 10*3/uL (ref 0.0–0.1)
Immature Granulocytes: 0 %
Lymphocytes Absolute: 2.1 10*3/uL (ref 0.7–3.1)
Lymphs: 27 %
MCH: 31.9 pg (ref 26.6–33.0)
MCHC: 33.7 g/dL (ref 31.5–35.7)
MCV: 95 fL (ref 79–97)
Monocytes Absolute: 0.5 10*3/uL (ref 0.1–0.9)
Monocytes: 7 %
Neutrophils Absolute: 4.6 10*3/uL (ref 1.4–7.0)
Neutrophils: 62 %
Platelets: 389 10*3/uL (ref 150–450)
RBC: 4.52 x10E6/uL (ref 3.77–5.28)
RDW: 12.3 % (ref 11.7–15.4)
WBC: 7.5 10*3/uL (ref 3.4–10.8)

## 2023-01-03 LAB — LIPID PANEL
Chol/HDL Ratio: 4.8 {ratio} — ABNORMAL HIGH (ref 0.0–4.4)
Cholesterol, Total: 223 mg/dL — ABNORMAL HIGH (ref 100–199)
HDL: 46 mg/dL (ref >39)
LDL Chol Calc (NIH): 154 mg/dL — ABNORMAL HIGH (ref 0–99)
Triglycerides: 127 mg/dL (ref 0–149)
VLDL Cholesterol Cal: 23 mg/dL (ref 5–40)

## 2023-01-03 LAB — THYROID PANEL WITH TSH
Free Thyroxine Index: 2 (ref 1.2–4.9)
T3 Uptake Ratio: 24 % (ref 24–39)
T4, Total: 8.5 ug/dL (ref 4.5–12.0)
TSH: 0.264 u[IU]/mL — ABNORMAL LOW (ref 0.450–4.500)

## 2023-01-03 MED ORDER — POTASSIUM CHLORIDE CRYS ER 20 MEQ PO TBCR
20.0000 meq | EXTENDED_RELEASE_TABLET | Freq: Two times a day (BID) | ORAL | 5 refills | Status: DC
Start: 2023-01-03 — End: 2023-11-14

## 2023-01-03 NOTE — Progress Notes (Signed)
Referred to endocrinology

## 2023-01-03 NOTE — Addendum Note (Signed)
Addended by: Bennie Pierini on: 01/03/2023 02:08 PM   Modules accepted: Orders

## 2023-01-08 ENCOUNTER — Other Ambulatory Visit: Payer: Self-pay

## 2023-01-08 MED ORDER — VALACYCLOVIR HCL 1 G PO TABS: 1000.0000 mg | ORAL_TABLET | Freq: Two times a day (BID) | ORAL | 0 refills | Status: AC

## 2023-01-10 ENCOUNTER — Other Ambulatory Visit: Payer: Self-pay | Admitting: Nurse Practitioner

## 2023-01-12 ENCOUNTER — Other Ambulatory Visit: Payer: Self-pay | Admitting: Nurse Practitioner

## 2023-01-12 DIAGNOSIS — G8929 Other chronic pain: Secondary | ICD-10-CM

## 2023-01-18 DIAGNOSIS — G2581 Restless legs syndrome: Secondary | ICD-10-CM

## 2023-01-20 MED ORDER — PRAMIPEXOLE DIHYDROCHLORIDE 0.5 MG PO TABS
ORAL_TABLET | ORAL | 1 refills | Status: DC
Start: 2023-01-20 — End: 2023-03-05

## 2023-01-20 NOTE — Telephone Encounter (Signed)
You are on max dose for restless leg. I sent I refill  Meds ordered this encounter  Medications   pramipexole (MIRAPEX) 0.5 MG tablet    Sig: TAKE (1) TABLET BY MOUTH (3) TIMES DAILY.    Dispense:  90 tablet    Refill:  1    Order Specific Question:   Supervising Provider    Answer:   Nils Pyle [[id number]]   Mary-Margaret Daphine Deutscher, FNP

## 2023-01-30 DIAGNOSIS — R600 Localized edema: Secondary | ICD-10-CM

## 2023-01-31 MED ORDER — FUROSEMIDE 20 MG PO TABS
40.0000 mg | ORAL_TABLET | Freq: Every day | ORAL | 1 refills | Status: DC
Start: 2023-01-31 — End: 2023-11-14

## 2023-02-10 ENCOUNTER — Other Ambulatory Visit: Payer: Self-pay | Admitting: Nurse Practitioner

## 2023-03-05 ENCOUNTER — Other Ambulatory Visit: Payer: Self-pay | Admitting: Nurse Practitioner

## 2023-03-05 DIAGNOSIS — G2581 Restless legs syndrome: Secondary | ICD-10-CM

## 2023-03-24 ENCOUNTER — Other Ambulatory Visit: Payer: Self-pay | Admitting: Nurse Practitioner

## 2023-04-01 ENCOUNTER — Other Ambulatory Visit: Payer: Self-pay | Admitting: Nurse Practitioner

## 2023-04-13 ENCOUNTER — Other Ambulatory Visit: Payer: Self-pay | Admitting: Nurse Practitioner

## 2023-04-13 DIAGNOSIS — G8929 Other chronic pain: Secondary | ICD-10-CM

## 2023-04-22 ENCOUNTER — Encounter: Payer: Self-pay | Admitting: *Deleted

## 2023-05-28 ENCOUNTER — Other Ambulatory Visit: Payer: Self-pay | Admitting: Nurse Practitioner

## 2023-05-28 DIAGNOSIS — G2581 Restless legs syndrome: Secondary | ICD-10-CM

## 2023-05-29 ENCOUNTER — Telehealth: Payer: Self-pay

## 2023-05-29 ENCOUNTER — Other Ambulatory Visit (HOSPITAL_COMMUNITY): Payer: Self-pay

## 2023-05-29 NOTE — Telephone Encounter (Signed)
 Can this documentation be added so the PA can be resubmitted?

## 2023-05-29 NOTE — Telephone Encounter (Addendum)
 Pharmacy Patient Advocate Encounter   Received notification from Pt Calls Messages that prior authorization for Prempro 0.3-1.5MG  tablets is required/requested.   Insurance verification completed.   The patient is insured through [facility] .   Per test claim:  ESTRADIOL-NORETHINDRONE, BIJUVA is preferred by the insurance.  If suggested medication is appropriate, Please send in a new RX and discontinue this one. If not, please advise as to why it's not appropriate so that we may request a Prior Authorization. Please note, some preferred medications may still require a PA.  If the suggested medications have not been trialed and there are no contraindications to their use, the PA will not be submitted, as it will not be approved.   Key: [id number]

## 2023-05-29 NOTE — Telephone Encounter (Signed)
 Patient has sent multiple messages asking if this PA has been completed on her Prempro. We have sent it to PA team as well. Any update on situation?

## 2023-05-29 NOTE — Telephone Encounter (Signed)
 PA request has been Started. New Encounter created for follow up. For additional info see Pharmacy Prior Auth telephone encounter from 05/29/23.

## 2023-06-02 ENCOUNTER — Other Ambulatory Visit (HOSPITAL_COMMUNITY): Payer: Self-pay

## 2023-06-02 ENCOUNTER — Other Ambulatory Visit: Payer: Self-pay | Admitting: Nurse Practitioner

## 2023-06-02 ENCOUNTER — Telehealth: Payer: Self-pay | Admitting: Family Medicine

## 2023-06-02 MED ORDER — ESTRADIOL-NORETHINDRONE ACET 1-0.5 MG PO TABS: 1.0000 | ORAL_TABLET | Freq: Every day | ORAL | 1 refills | Status: DC

## 2023-06-02 MED ORDER — BIJUVA 0.5-100 MG PO CAPS: 1.0000 | ORAL_CAPSULE | Freq: Every day | ORAL | 1 refills | Status: DC

## 2023-06-02 NOTE — Telephone Encounter (Signed)
Can this be resubmitted?

## 2023-06-02 NOTE — Telephone Encounter (Signed)
What does it need to say?

## 2023-06-02 NOTE — Telephone Encounter (Signed)
 Prempro was changed to Bijuva.   Pharmacy Patient Advocate Encounter  Insurance verification completed.   The patient is insured through [facility]   Ran test claim for Bijuva 0.5-100mg  caps. Currently a quantity of 30 is a 30 day supply and the co-pay is $18 .   This test claim was processed through [facility]- copay amounts may vary at other pharmacies due to pharmacy/plan contracts, or as the patient moves through the different stages of their insurance plan.

## 2023-06-02 NOTE — Telephone Encounter (Signed)
 Note about menopause in 12/2022 note- please resend to prior auths

## 2023-06-02 NOTE — Addendum Note (Signed)
 Addended by: Cleda Daub on: 06/02/2023 03:56 PM   Modules accepted: Orders

## 2023-06-02 NOTE — Telephone Encounter (Signed)
 Copied from CRM [id number]. Topic: Clinical - Prescription Issue >> Jun 02, 2023  9:35 AM Antwanette L wrote: Reason for CRM: Robin from [facility] is calling in to let us know they cannot refill the Estradiol-Progesterone (BIJUVA) 0.5-100 MG CAPS Due to the ingredients. Zella Ball said the prescription should be called into another pharmacy or change the original ingredients. Zella Ball can be reached at [phone].

## 2023-06-02 NOTE — Telephone Encounter (Signed)
 Estradiol-Norethindrone and Bijuva are preferred. If patient has not tried and failed or has a medical reason why she cannot take the preferred medications, the insurance will deny PA. Documentation is also required stating why patient cannot take the preferred medication. I have searched patient's profile, there is no mention of trying preferred medications.

## 2023-06-02 NOTE — Telephone Encounter (Signed)
 Called and spoke with pharmacist. She stated that the med can be filled if it is sent in separately. Estradiol 0.5mg  and Progesterone 100mg .

## 2023-06-05 ENCOUNTER — Other Ambulatory Visit: Payer: Self-pay | Admitting: Nurse Practitioner

## 2023-06-05 DIAGNOSIS — M545 Low back pain, unspecified: Secondary | ICD-10-CM

## 2023-06-05 MED ORDER — TIZANIDINE HCL 2 MG PO TABS: 4.0000 mg | ORAL_TABLET | Freq: Three times a day (TID) | ORAL | 0 refills | Status: DC

## 2023-06-06 ENCOUNTER — Telehealth: Admitting: Nurse Practitioner

## 2023-06-06 ENCOUNTER — Encounter: Payer: Self-pay | Admitting: Nurse Practitioner

## 2023-06-06 DIAGNOSIS — F39 Unspecified mood [affective] disorder: Secondary | ICD-10-CM

## 2023-06-06 MED ORDER — QUETIAPINE FUMARATE 25 MG PO TABS: 25.0000 mg | ORAL_TABLET | Freq: Every day | ORAL | 1 refills | Status: DC

## 2023-06-06 NOTE — Progress Notes (Signed)
 Virtual Visit Consent   Maria [name], you are scheduled for a virtual visit with Maria Daphine Deutscher, FNP, a [facility] provider, [name].     Just as with appointments in the office, your consent must be obtained to participate.  Your consent will be active for this visit and any virtual visit you may have with one of our providers in the next 365 days.     If you have a MyChart account, a copy of this consent can be sent to you electronically.  All virtual visits are billed to your insurance company just like a traditional visit in the office.    As this is a virtual visit, video technology does not allow for your provider to perform a traditional examination.  This may limit your provider's ability to fully assess your condition.  If your provider identifies any concerns that need to be evaluated in person or the need to arrange testing (such as labs, EKG, etc.), we will make arrangements to do so.     Although advances in technology are sophisticated, we cannot ensure that it will always work on either your end or our end.  If the connection with a video visit is poor, the visit may have to be switched to a telephone visit.  With either a video or telephone visit, we are not always able to ensure that we have a secure connection.     I need to obtain your verbal consent now.   Are you willing to proceed with your visit [name]? YES   Maria [name] has provided verbal consent on 06/06/2023 for a virtual visit (video or telephone).   Maria Daphine Deutscher, FNP   Date: 06/06/2023 11:59 AM   Virtual Visit via Video Note   I, Maria [name], connected with Maria [name] ([id number], 08-Jun-1970) on 06/06/23 at 12:30 PM EST by a video-enabled telemedicine application and verified that I am speaking with the correct person using two identifiers.  Location: Patient: Virtual Visit Location Patient: Home Provider: Virtual Visit Location Provider: Mobile   I discussed the limitations of  evaluation and management by telemedicine and the availability of in person appointments. The patient expressed understanding and agreed to proceed.    History of Present Illness: Maria [name] is a 53 y.o. who identifies as a female who was assigned female at birth, and is being seen [name] for discuss meds.  HPI: Patient doing video visit for 2 issues; - had to change horomone meds and patient wanted to make sure it had progesterone in it. Which is does. - she has been very moody. Says she is moody all the time. Gets angry very easily    Review of Systems  Constitutional:  Negative for diaphoresis and weight loss.  Eyes:  Negative for blurred vision, double vision and pain.  Respiratory:  Negative for shortness of breath.   Cardiovascular:  Negative for chest pain, palpitations, orthopnea and leg swelling.  Gastrointestinal:  Negative for abdominal pain.  Skin:  Negative for rash.  Neurological:  Negative for dizziness, sensory change, loss of consciousness, weakness and headaches.  Endo/Heme/Allergies:  Negative for polydipsia. Does not bruise/bleed easily.  Psychiatric/Behavioral:  Negative for memory loss. The patient does not have insomnia.   All other systems reviewed and are negative.   Problems:  Patient Active Problem List   Diagnosis Date Noted   BMI 28.0-28.9,adult 07/23/2021   Restless leg 07/09/2019   Annual physical exam 02/12/2018   Essential hypertension 06/02/2014  Thyroid nodule, left 08/31/2013   Hypothyroidism 08/10/2012   Hyperlipidemia 08/10/2012   Depression 08/10/2012   Chronic back pain 08/10/2012   Insomnia 08/10/2012    Allergies:  Allergies  Allergen Reactions   Compazine [Prochlorperazine Edisylate]    Sulfa Antibiotics Rash   Medications:  Current Outpatient Medications:    estradiol-norethindrone (ACTIVELLA) 1-0.5 MG tablet, Take 1 tablet by mouth daily. To replace all previous orders of menopausal meds, Disp: 90 tablet, Rfl: 1    amLODipine-benazepril (LOTREL) 10-20 MG capsule, TAKE (1) CAPSULE BY MOUTH EVERY DAY., Disp: 90 capsule, Rfl: 1   escitalopram (LEXAPRO) 20 MG tablet, Take 1 tablet (20 mg total) by mouth daily., Disp: 30 tablet, Rfl: 5   furosemide (LASIX) 20 MG tablet, Take 2 tablets (40 mg total) by mouth daily., Disp: 90 tablet, Rfl: 1   gabapentin (NEURONTIN) 400 MG capsule, TAKE (3) CAPSULES BY MOUTH AT BEDTIME., Disp: 90 capsule, Rfl: 5   HYDROcodone-acetaminophen (NORCO) 7.5-325 MG tablet, Take 1 tablet by mouth 4 (four) times daily as needed., Disp: , Rfl:    ibuprofen (IBU) 800 MG tablet, TAKE (1) TABLET BY MOUTH EVERY EIGHT HOURS AS NEEDED., Disp: 60 tablet, Rfl: 1   Multiple Vitamin (MULTIVITAMIN) tablet, Take 1 tablet by mouth daily., Disp: , Rfl:    potassium chloride SA (KLOR-CON M) 20 MEQ tablet, Take 1 tablet (20 mEq total) by mouth 2 (two) times daily., Disp: 30 tablet, Rfl: 5   pramipexole (MIRAPEX) 0.5 MG tablet, TAKE ONE TABLET BY MOUTH THREE TIMES DAILY, Disp: 90 tablet, Rfl: 0   PREMPRO 0.3-1.5 MG tablet, TAKE (1) TABLET BY MOUTH ONCE DAILY., Disp: 28 tablet, Rfl: 2   tiZANidine (ZANAFLEX) 2 MG tablet, Take 2 tablets (4 mg total) by mouth 3 (three) times daily., Disp: 180 tablet, Rfl: 0  Observations/Objective: Patient is well-developed, well-nourished in no acute distress.  Resting comfortably  at home.  Head is normocephalic, atraumatic.  No labored breathing.  Speech is clear and coherent with logical content.  Patient is alert and oriented at baseline.    Assessment and Plan:  Maria [name] with chief complaint of medication issues  1. Mood disorder (HCC) (Primary) Stress management Continue lexapro Start on seroquel at night  Meds ordered this encounter  Medications   QUEtiapine (SEROQUEL) 25 MG tablet    Sig: Take 1 tablet (25 mg total) by mouth at bedtime.    Dispense:  90 tablet    Refill:  1    Supervising Provider:   Arville Care A [[id number]]      Follow Up Instructions: I discussed the assessment and treatment plan with the patient. The patient was provided an opportunity to ask questions and all were answered. The patient agreed with the plan and demonstrated an understanding of the instructions.  A copy of instructions were sent to the patient via MyChart.  The patient was advised to call back or seek an in-person evaluation if the symptoms worsen or if the condition fails to improve as anticipated.  Time:  I spent 7 minutes with the patient via telehealth technology discussing the above problems/concerns.    Maria Daphine Deutscher, FNP

## 2023-06-06 NOTE — Patient Instructions (Signed)
 Understanding Bipolar Mood Disorder In this video, you will learn about bipolar mood disorder, including risk factors, symptoms, diagnosis, and treatment options. To view the content, go to this web address: [email or web address]  This video will expire on: 03/12/2025. If you need access to this video following this date, please reach out to the healthcare provider who assigned it to you. This information is not intended to replace advice given to you by your health care provider. Make sure you discuss any questions you have with your health care provider. Elsevier Patient Education  2024 [employer].

## 2023-06-12 ENCOUNTER — Telehealth: Payer: Self-pay

## 2023-06-12 ENCOUNTER — Other Ambulatory Visit (HOSPITAL_COMMUNITY): Payer: Self-pay

## 2023-06-12 NOTE — Telephone Encounter (Signed)
 Pharmacy Patient Advocate Encounter   Received notification from CoverMyMeds that prior authorization for Escitalopram Oxalate 20MG  tablets is required/requested.   Insurance verification completed.   The patient is insured through [facility] .   Per test claim: Refill too soon. PA is not needed at this time. Medication was filled 06/04/23. Next eligible fill date is 06/27/23.

## 2023-06-23 ENCOUNTER — Other Ambulatory Visit: Payer: Self-pay | Admitting: Nurse Practitioner

## 2023-06-23 DIAGNOSIS — G2581 Restless legs syndrome: Secondary | ICD-10-CM

## 2023-06-23 DIAGNOSIS — G8929 Other chronic pain: Secondary | ICD-10-CM

## 2023-06-23 MED ORDER — TIZANIDINE HCL 2 MG PO TABS
4.0000 mg | ORAL_TABLET | Freq: Three times a day (TID) | ORAL | 0 refills | Status: DC
Start: 2023-06-23 — End: 2023-07-28

## 2023-06-23 MED ORDER — PRAMIPEXOLE DIHYDROCHLORIDE 0.5 MG PO TABS: 0.5000 mg | ORAL_TABLET | Freq: Three times a day (TID) | ORAL | 0 refills | Status: DC

## 2023-06-25 ENCOUNTER — Ambulatory Visit: Payer: 59 | Admitting: Nurse Practitioner

## 2023-06-27 ENCOUNTER — Ambulatory Visit: Payer: 59 | Admitting: Nurse Practitioner

## 2023-06-27 ENCOUNTER — Other Ambulatory Visit: Payer: Self-pay | Admitting: Nurse Practitioner

## 2023-06-27 DIAGNOSIS — F339 Major depressive disorder, recurrent, unspecified: Secondary | ICD-10-CM

## 2023-07-13 ENCOUNTER — Other Ambulatory Visit: Payer: Self-pay | Admitting: Nurse Practitioner

## 2023-07-13 DIAGNOSIS — G2581 Restless legs syndrome: Secondary | ICD-10-CM

## 2023-07-28 ENCOUNTER — Other Ambulatory Visit: Payer: Self-pay | Admitting: Nurse Practitioner

## 2023-07-28 DIAGNOSIS — F339 Major depressive disorder, recurrent, unspecified: Secondary | ICD-10-CM

## 2023-07-28 DIAGNOSIS — G8929 Other chronic pain: Secondary | ICD-10-CM

## 2023-07-30 ENCOUNTER — Other Ambulatory Visit: Payer: Self-pay | Admitting: Nurse Practitioner

## 2023-08-26 ENCOUNTER — Other Ambulatory Visit: Payer: Self-pay | Admitting: Nurse Practitioner

## 2023-08-26 DIAGNOSIS — M545 Low back pain, unspecified: Secondary | ICD-10-CM

## 2023-08-26 NOTE — Telephone Encounter (Signed)
 Last OV 01/02/2023. Last RF 07/28/2023. Next OV not scheduled

## 2023-08-27 ENCOUNTER — Other Ambulatory Visit: Payer: Self-pay | Admitting: Anesthesiology

## 2023-08-27 DIAGNOSIS — M5416 Radiculopathy, lumbar region: Secondary | ICD-10-CM

## 2023-09-12 ENCOUNTER — Other Ambulatory Visit: Payer: Self-pay | Admitting: *Deleted

## 2023-09-12 DIAGNOSIS — I10 Essential (primary) hypertension: Secondary | ICD-10-CM

## 2023-09-12 MED ORDER — AMLODIPINE BESY-BENAZEPRIL HCL 10-20 MG PO CAPS: ORAL_CAPSULE | ORAL | 0 refills | Status: DC

## 2023-09-16 ENCOUNTER — Telehealth: Payer: Self-pay

## 2023-09-16 DIAGNOSIS — G8929 Other chronic pain: Secondary | ICD-10-CM

## 2023-09-16 NOTE — Transitions of Care (Post Inpatient/ED Visit) (Signed)
 Today's TOC FU Call Status: Today's TOC FU Call Status:: Successful TOC FU Call Completed TOC FU Call Complete Date: 09/16/23 Patient's Name and Date of Birth confirmed.  Transition Care Management Follow-up Telephone Call Date of Discharge: 09/15/23 Discharge Facility: Other [facility]) Name of Other (Non[facility]) Discharge Facility: [facility] [place] . Type of Discharge: Inpatient Admission Primary Inpatient Discharge Diagnosis:: Lumbar fusion/Sepsis secondary to UTI. How have you been since you were released from the hospital?: Same Any questions or concerns?: No  Items Reviewed: Did you receive and understand the discharge instructions provided?: Yes Medications obtained,verified, and reconciled?: Yes (Medications Reviewed) Dietary orders reviewed?: NA Do you have support at home?: Yes People in Home [RPT]: spouse Name of Support/Comfort Primary Source: Yono,Scott (Spouse)  [phone] (Mobile)  Medications Reviewed Today: Medications Reviewed Today     Reviewed by Areta Beer, RN (Case Manager) on 09/16/23 at 1418  Med List Status: <None>   Medication Order Taking? Sig Documenting Provider Last Dose Status Informant  acetaminophen (TYLENOL) 500 MG tablet [id number] Yes Take 500 mg by mouth every 6 (six) hours. [provider]  Active   amLODipine -benazepril  (LOTREL) 10-20 MG capsule [id number] Yes TAKE (1) CAPSULE BY MOUTH EVERY DAY.**NEEDS TO BE SEEN BEFORE NEXT REFILL** Martin, Mary-Margaret, FNP  Active   calcium  carbonate (TUMS - DOSED IN MG ELEMENTAL CALCIUM ) 500 MG chewable tablet [id number] Yes Chew 2 tablets by mouth 4 (four) times daily as needed for indigestion or heartburn. [provider]  Active   ciprofloxacin  (CIPRO ) 500 MG tablet [id number] Yes Take 500 mg by mouth 2 (two) times daily. X 4 days from 09/15/23. [provider]  Active   escitalopram  (LEXAPRO ) 20 MG tablet [id number] Yes Take 1 tablet (20 mg total)  by mouth daily. Gaylyn Keas, Mary-Margaret, FNP  Active   estradiol -norethindrone  (ACTIVELLA) 1-0.5 MG tablet [id number] Yes Take 1 tablet by mouth daily. To replace all previous orders of menopausal meds Gaylyn Keas, Mary-Margaret, FNP  Active   furosemide  (LASIX ) 20 MG tablet [id number]  Take 2 tablets (40 mg total) by mouth daily.  Patient not taking: Reported on 09/16/2023   Delfina Feller, FNP  Active   gabapentin  (NEURONTIN ) 400 MG capsule [id number] Yes TAKE (3) CAPSULES BY MOUTH AT BEDTIME.  Patient taking differently: Take 400 mg by mouth at bedtime. TAKE (3) CAPSULES BY MOUTH AT BEDTIME.   Gaylyn Keas, Mary-Margaret, FNP  Active   HYDROcodone-acetaminophen [facility]) 7.5-325 MG tablet [id number]  Take 1 tablet by mouth 4 (four) times daily as needed.  Patient not taking: Reported on 09/16/2023   [provider]  Active   ibuprofen  (ADVIL ) 800 MG tablet [id number]  TAKE (1) TABLET BY MOUTH EVERY EIGHT HOURS AS NEEDED.  Patient not taking: Reported on 09/16/2023   Delfina Feller, FNP  Active   methocarbamol (ROBAXIN) 500 MG tablet [id number] Yes Take 500 mg by mouth 3 (three) times daily. [provider]  Active   Multiple Vitamin (MULTIVITAMIN) tablet [id number]  Take 1 tablet by mouth daily. [provider]  Active   oxyCODONE (ROXICODONE) 15 MG immediate release tablet [id number] Yes Take 15 mg by mouth every 6 (six) hours. [provider]  Active   polyethylene glycol (MIRALAX / GLYCOLAX) 17 g packet [id number]  Take 17 g by mouth daily. Dissolve in 8 ounces of water and drink once a day for 10 days.  Patient not taking: Reported on 09/16/2023   [provider]  Active   potassium chloride  SA (KLOR-CON  M) 20 MEQ  tablet [id number]  Take 1 tablet (20 mEq total) by mouth 2 (two) times daily.  Patient not taking: Reported on 09/16/2023   Delfina Feller, FNP  Active   pramipexole  (MIRAPEX ) 0.5 MG tablet [id number] Yes Take 1 tablet (0.5 mg total) by mouth  3 (three) times daily. **NEEDS TO BE SEEN BEFORE NEXT REFILLGaylyn Keas, Mary-Margaret, FNP  Active   PREMPRO  0.3-1.5 MG tablet [id number]  TAKE (1) TABLET BY MOUTH ONCE DAILY. Gaylyn Keas, Mary-Margaret, FNP  Active   QUEtiapine  (SEROQUEL ) 25 MG tablet [id number] Yes Take 1 tablet (25 mg total) by mouth at bedtime. Gaylyn Keas, Mary-Margaret, FNP  Active   sennosides-docusate sodium (SENOKOT-S) 8.6-50 MG tablet [id number] Yes Take 1 tablet by mouth daily. At bedtime per discharge orders 09/15/2023 [provider]  Active   tiZANidine  (ZANAFLEX ) 2 MG tablet [id number]  TAKE TWO TABLETS BY MOUTH THREE TIMES DAILY  Patient not taking: Reported on 09/16/2023   Delfina Feller, FNP  Active   valACYclovir  (VALTREX ) 1000 MG tablet [id number]  Take 1,000 mg by mouth 2 (two) times daily.  Patient not taking: Reported on 09/16/2023   [provider]  Active   Med List Note Areta Beer, RN 09/16/23 1353): Patient has back surgery in [place] , this Medication reconciliation is from the discharge documents and review with patient telephonically on 09/16/23.             Home Care and Equipment/Supplies: Were Home Health Services Ordered?: No (Unable to tell, patient will contact prior outpatient rehab center [facility] when cleard by neurosurgeon on [place]  where she had surgery while visiting family.) Any new equipment or medical supplies ordered?: No (Has a rollator from prior surgery, no DME orders seen, Patient states she needs a shower stool, a long  reacher but will obtain orders where needed when visits surgeon for follow up on 6/25 in [place] .)  Functional Questionnaire: Do you need assistance with bathing/showering or dressing?: Yes (post lumbar fusion precautions) Do you need assistance with meal preparation?: No Do you need assistance with eating?: No Do you have difficulty maintaining continence: No Do you need assistance with getting out of bed/getting out of a  chair/moving?: Yes (post lumbar fusion precautions) Do you have difficulty managing or taking your medications?: No  Follow up appointments reviewed: PCP Follow-up appointment confirmed?: Yes Date of PCP follow-up appointment?: 09/23/23 (Karyn Pai, [facility] [place]) Follow-up Provider: [facility] [place] Specialist [facility] Follow-up appointment confirmed?: Yes Date of Specialist follow-up appointment?: 09/24/23 Follow-Up Specialty Provider:: Salena Craven Rapp,[facility] Do you understand care options if your condition(s) worsen?: Yes-patient verbalized understanding  SDOH Interventions Today    Flowsheet Row Most Recent Value  SDOH Interventions   Food Insecurity Interventions Intervention Not Indicated  Housing Interventions Intervention Not Indicated  Transportation Interventions Intervention Not Indicated  Utilities Interventions Intervention Not Indicated  Social Connections Interventions Intervention Not Indicated, Patient Declined  Health Literacy Interventions Intervention Not Indicated   09/16/23: Completed post discharge outreach call with patient. Declined 30 day program due to having had lumbar surgery before and having multiple providers she is very familiar with and able to contact with any needs. Had PCP appointment arranged in [place]  prior to being cleared to return home. Also has f/u with neurosurgeon for HFU and clearance to travel home with spouse. Patient understand discharge instructions, has quit smoking, is cautious with pain meds after prior issues with  multiple back surgeries. She will contact local PCP to set up appointments once cleared to travel, and will obtain OP rehab orders for [facility], where she has gone before. Upon seeing therapy, she may need additional DME such as a reacher and shower stool at home pending follow ups. Thanked RN CM for  follow up call. This note was routed to local PCP. Medication review note: Some meds that had interaction alerts were meds that were discontinued on discharge 09/15/23. Reviewed carefully with patient but please review carefully on HFU.    Katheryn Pandy MSN, RN RN Case Sales executive Health  [facility] Office Hours M-F [phone] Direct Dial: [phone] Main Phone [phone]  Fax: [phone] [email or web address]

## 2023-10-28 ENCOUNTER — Other Ambulatory Visit: Payer: Self-pay | Admitting: Nurse Practitioner

## 2023-10-28 DIAGNOSIS — Z1231 Encounter for screening mammogram for malignant neoplasm of breast: Secondary | ICD-10-CM

## 2023-10-29 ENCOUNTER — Inpatient Hospital Stay: Admission: RE | Admit: 2023-10-29 | Source: Ambulatory Visit

## 2023-11-14 ENCOUNTER — Ambulatory Visit: Admitting: Nurse Practitioner

## 2023-11-14 ENCOUNTER — Encounter: Payer: Self-pay | Admitting: Nurse Practitioner

## 2023-11-14 VITALS — BP 142/88 | HR 63 | Temp 97.6°F | Ht 70.0 in | Wt 198.0 lb

## 2023-11-14 DIAGNOSIS — F5101 Primary insomnia: Secondary | ICD-10-CM

## 2023-11-14 DIAGNOSIS — E041 Nontoxic single thyroid nodule: Secondary | ICD-10-CM

## 2023-11-14 DIAGNOSIS — G2581 Restless legs syndrome: Secondary | ICD-10-CM

## 2023-11-14 DIAGNOSIS — I10 Essential (primary) hypertension: Secondary | ICD-10-CM

## 2023-11-14 DIAGNOSIS — E782 Mixed hyperlipidemia: Secondary | ICD-10-CM

## 2023-11-14 DIAGNOSIS — R600 Localized edema: Secondary | ICD-10-CM

## 2023-11-14 DIAGNOSIS — F339 Major depressive disorder, recurrent, unspecified: Secondary | ICD-10-CM

## 2023-11-14 DIAGNOSIS — E876 Hypokalemia: Secondary | ICD-10-CM

## 2023-11-14 DIAGNOSIS — E039 Hypothyroidism, unspecified: Secondary | ICD-10-CM | POA: Diagnosis not present

## 2023-11-14 DIAGNOSIS — M545 Low back pain, unspecified: Secondary | ICD-10-CM

## 2023-11-14 DIAGNOSIS — G8929 Other chronic pain: Secondary | ICD-10-CM

## 2023-11-14 DIAGNOSIS — Z6828 Body mass index (BMI) 28.0-28.9, adult: Secondary | ICD-10-CM

## 2023-11-14 MED ORDER — FUROSEMIDE 20 MG PO TABS: 40.0000 mg | ORAL_TABLET | Freq: Every day | ORAL | 1 refills | Status: DC

## 2023-11-14 MED ORDER — PRAMIPEXOLE DIHYDROCHLORIDE 0.5 MG PO TABS: 0.5000 mg | ORAL_TABLET | Freq: Three times a day (TID) | ORAL | 1 refills | Status: DC

## 2023-11-14 MED ORDER — AMLODIPINE BESY-BENAZEPRIL HCL 10-20 MG PO CAPS: ORAL_CAPSULE | ORAL | 1 refills | Status: DC

## 2023-11-14 MED ORDER — QUETIAPINE FUMARATE 25 MG PO TABS: 25.0000 mg | ORAL_TABLET | Freq: Every day | ORAL | 1 refills | Status: AC

## 2023-11-14 MED ORDER — GABAPENTIN 400 MG PO CAPS: ORAL_CAPSULE | ORAL | 1 refills | Status: AC

## 2023-11-14 MED ORDER — ESCITALOPRAM OXALATE 20 MG PO TABS
20.0000 mg | ORAL_TABLET | Freq: Every day | ORAL | 1 refills | Status: AC
Start: 2023-11-14 — End: ?

## 2023-11-14 MED ORDER — POTASSIUM CHLORIDE CRYS ER 20 MEQ PO TBCR: 20.0000 meq | EXTENDED_RELEASE_TABLET | Freq: Two times a day (BID) | ORAL | 5 refills | Status: DC

## 2023-11-14 NOTE — Patient Instructions (Signed)
 Peripheral Edema  Peripheral edema is swelling that is caused by a buildup of fluid. Peripheral edema most often affects the lower legs, ankles, and feet. It can also develop in the arms, hands, and face. The area of the body that has peripheral edema will look swollen. It may also feel heavy or warm. Your clothes may start to feel tight. Pressing on the area may make a temporary dent in your skin (pitting edema). You may not be able to move your swollen arm or leg as much as usual. There are many causes of peripheral edema. It can happen because of a complication of other conditions such as heart failure, kidney disease, or a problem with your circulation. It also can be a side effect of certain medicines or happen because of an infection. It often happens to women during pregnancy. Sometimes, the cause is not known. Follow these instructions at home: Managing pain, stiffness, and swelling  Raise (elevate) your legs while you are sitting or lying down. Move around often to prevent stiffness and to reduce swelling. Do not sit or stand for long periods of time. Do not wear tight clothing. Do not wear garters on your upper legs. Exercise your legs to get your circulation going. This helps to move the fluid back into your blood vessels, and it may help the swelling go down. Wear compression stockings as told by your health care provider. These stockings help to prevent blood clots and reduce swelling in your legs. It is important that these are the correct size. These stockings should be prescribed by your doctor to prevent possible injuries. If elastic bandages or wraps are recommended, use them as told by your health care provider. Medicines Take over-the-counter and prescription medicines only as told by your health care provider. Your health care provider may prescribe medicine to help your body get rid of excess water (diuretic). Take this medicine if you are told to take it. General  instructions Eat a low-salt (low-sodium) diet as told by your health care provider. Sometimes, eating less salt may reduce swelling. Pay attention to any changes in your symptoms. Moisturize your skin daily to help prevent skin from cracking and draining. Keep all follow-up visits. This is important. Contact a health care provider if: You have a fever. You have swelling in only one leg. You have increased swelling, redness, or pain in one or both of your legs. You have drainage or sores at the area where you have edema. Get help right away if: You have edema that starts suddenly or is getting worse, especially if you are pregnant or have a medical condition. You develop shortness of breath, especially when you are lying down. You have pain in your chest or abdomen. You feel weak. You feel like you will faint. These symptoms may be an emergency. Get help right away. Call 911. Do not wait to see if the symptoms will go away. Do not drive yourself to the hospital. Summary Peripheral edema is swelling that is caused by a buildup of fluid. Peripheral edema most often affects the lower legs, ankles, and feet. Move around often to prevent stiffness and to reduce swelling. Do not sit or stand for long periods of time. Pay attention to any changes in your symptoms. Contact a health care provider if you have edema that starts suddenly or is getting worse, especially if you are pregnant or have a medical condition. Get help right away if you develop shortness of breath, especially when lying down.  This information is not intended to replace advice given to you by your health care provider. Make sure you discuss any questions you have with your health care provider. Document Revised: 11/20/2020 Document Reviewed: 11/20/2020 Elsevier Patient Education  2024 [employer].

## 2023-11-14 NOTE — Addendum Note (Signed)
 Addended by: VIKTORIA ALAN MATSU on: 11/14/2023 11:43 AM   Modules accepted: Orders

## 2023-11-14 NOTE — Progress Notes (Signed)
 Subjective:    Patient ID: Maria [name], female    DOB: 01-Sep-1970, 53 y.o.   MRN: [id number]   Chief Complaint: medical management of chronic issues     HPI:  Maria [name] is a 53 y.o. who identifies as a female who was assigned female at birth.   Social history: Lives with: husband and kids Work history: currently not working   Comes in today for follow up of the following chronic medical issues:  1. Essential hypertension No c/o chest pain, sob or headache. Does not check blood pressure at home. BP Readings from Last 3 Encounters:  01/02/23 134/88  08/16/22 (!) 156/94  01/22/22 (!) 100/57     2. Mixed hyperlipidemia Does watch diet and does occasional exercise. Lab Results  Component Value Date   CHOL 223 (H) 01/02/2023   HDL 46 01/02/2023   LDLCALC 154 (H) 01/02/2023   TRIG 127 01/02/2023   CHOLHDL 4.8 (H) 01/02/2023     3. Acquired hypothyroidism No issues that aware of Lab Results  Component Value Date   TSH 0.264 (L) 01/02/2023   T4TOTAL 8.5 01/02/2023     4. Thyroid nodule, left Was tested and there was no concerns. No change in size  5. Recurrent major depressive disorder, remission status unspecified (HCC) Is on lexapro and is doing well.    09/16/2023    1:34 PM 01/02/2023    2:01 PM 08/16/2022    3:22 PM  Depression screen PHQ 2/9  Decreased Interest 0 0 2  Down, Depressed, Hopeless 0 0 2  PHQ - 2 Score 0 0 4  Altered sleeping  1 3  Tired, decreased energy  1 1  Change in appetite  1 3  Feeling bad or failure about yourself   0 3  Trouble concentrating  0 3  Moving slowly or fidgety/restless  0 0  Suicidal thoughts  0 1  PHQ-9 Score  3 18  Difficult doing work/chores  Not difficult at all Somewhat difficult     6. Primary insomnia No issues sleeping and is doing weel  7. Restless leg Is on neurontin and is doing well  8. Chronic midline low back pain without sciatica Has chronic back pain and sees pain  management. Patient went to [place]  to visit Maria [name] son and his family. While there Maria [name] had to have emergency back surgery. Got an infection in incision and was in the hospital for several days. Maria [name] has since recovered. Still on pain medication. Is doing much better. Pain in back has improved but Maria [name] hip is bothering Maria [name] now. Seeing specialist for Maria [name] hip pain.   9. BMI 28.0-28.9,adult Weight is up 30lbs- patient quit smoking Wt Readings from Last 3 Encounters:  11/14/23 198 lb (89.8 kg)  01/02/23 164 lb (74.4 kg)  08/16/22 166 lb (75.3 kg)   BMI Readings from Last 3 Encounters:  11/14/23 28.41 kg/m  01/02/23 23.53 kg/m  08/16/22 23.82 kg/m     New complaints: - hospital stopped Maria [name] lasix due to potassium- has lot sof fluid bil lower ext.  Allergies  Allergen Reactions   Compazine [Prochlorperazine Edisylate]    Sulfa Antibiotics Rash   Outpatient Encounter Medications as of 11/14/2023  Medication Sig   acetaminophen (TYLENOL) 500 MG tablet Take 500 mg by mouth every 6 (six) hours.   amLODipine-benazepril (LOTREL) 10-20 MG capsule TAKE (1) CAPSULE BY MOUTH EVERY DAY.**NEEDS TO BE SEEN BEFORE NEXT REFILL**   calcium carbonate (TUMS - DOSED IN  MG ELEMENTAL CALCIUM) 500 MG chewable tablet Chew 2 tablets by mouth 4 (four) times daily as needed for indigestion or heartburn.   escitalopram (LEXAPRO) 20 MG tablet Take 1 tablet (20 mg total) by mouth daily.   estradiol-norethindrone (ACTIVELLA) 1-0.5 MG tablet Take 1 tablet by mouth daily. To replace all previous orders of menopausal meds   furosemide (LASIX) 20 MG tablet Take 2 tablets (40 mg total) by mouth daily. (Patient not taking: Reported on 09/16/2023)   gabapentin (NEURONTIN) 400 MG capsule TAKE (3) CAPSULES BY MOUTH AT BEDTIME. (Patient taking differently: Take 400 mg by mouth at bedtime. TAKE (3) CAPSULES BY MOUTH AT BEDTIME.)   HYDROcodone-acetaminophen (NORCO) 7.5-325 MG tablet Take 1 tablet by mouth 4 (four) times daily as  needed. (Patient not taking: Reported on 09/16/2023)   ibuprofen (ADVIL) 800 MG tablet TAKE (1) TABLET BY MOUTH EVERY EIGHT HOURS AS NEEDED. (Patient not taking: Reported on 09/16/2023)   methocarbamol (ROBAXIN) 500 MG tablet Take 500 mg by mouth 3 (three) times daily.   Multiple Vitamin (MULTIVITAMIN) tablet Take 1 tablet by mouth daily.   oxyCODONE (ROXICODONE) 15 MG immediate release tablet Take 15 mg by mouth every 6 (six) hours.   polyethylene glycol (MIRALAX / GLYCOLAX) 17 g packet Take 17 g by mouth daily. Dissolve in 8 ounces of water and drink once a day for 10 days. (Patient not taking: Reported on 09/16/2023)   potassium chloride SA (KLOR-CON M) 20 MEQ tablet Take 1 tablet (20 mEq total) by mouth 2 (two) times daily. (Patient not taking: Reported on 09/16/2023)   pramipexole (MIRAPEX) 0.5 MG tablet Take 1 tablet (0.5 mg total) by mouth 3 (three) times daily. **NEEDS TO BE SEEN BEFORE NEXT REFILL**   PREMPRO 0.3-1.5 MG tablet TAKE (1) TABLET BY MOUTH ONCE DAILY.   QUEtiapine (SEROQUEL) 25 MG tablet Take 1 tablet (25 mg total) by mouth at bedtime.   sennosides-docusate sodium (SENOKOT-S) 8.6-50 MG tablet Take 1 tablet by mouth daily. At bedtime per discharge orders 09/15/2023   tiZANidine (ZANAFLEX) 2 MG tablet TAKE TWO TABLETS BY MOUTH THREE TIMES DAILY (Patient not taking: Reported on 09/16/2023)   No facility-administered encounter medications on file as of 11/14/2023.    Past Surgical History:  Procedure Laterality Date   BACK SURGERY  2018   BREAST EXCISIONAL BIOPSY Right 1993   BREAST SURGERY     lumpectomy   CESAREAN SECTION     CHOLECYSTECTOMY     ENDOMETRIAL ABLATION     l5-s1 fusion  2008, 2009, 2011   THYROIDECTOMY Right 2001   TOTAL HIP ARTHROPLASTY Left     Family History  Problem Relation Age of Onset   Breast cancer Mother    Hypertension Mother    Cancer Mother        breast   Hypothyroidism Mother    Hypertension Father    Hypothyroidism Father        Controlled substance contract: n/a     Review of Systems  Constitutional:  Negative for diaphoresis.  Eyes:  Negative for pain.  Respiratory:  Negative for shortness of breath.   Cardiovascular:  Negative for chest pain, palpitations and leg swelling.  Gastrointestinal:  Negative for abdominal pain.  Endocrine: Negative for polydipsia.  Skin:  Negative for rash.  Neurological:  Negative for dizziness, weakness and headaches.  Hematological:  Does not bruise/bleed easily.  All other systems reviewed and are negative.      Objective:   Physical Exam Vitals and nursing  note reviewed.  Constitutional:      General: Maria [name] is not in acute distress.    Appearance: Normal appearance. Maria [name] is well-developed.  HENT:     Head: Normocephalic.     Right Ear: Tympanic membrane normal.     Left Ear: Tympanic membrane normal.     Nose: Nose normal.     Mouth/Throat:     Mouth: Mucous membranes are moist.  Eyes:     Pupils: Pupils are equal, round, and reactive to light.  Neck:     Vascular: No carotid bruit or JVD.  Cardiovascular:     Rate and Rhythm: Normal rate and regular rhythm.     Heart sounds: Normal heart sounds.  Pulmonary:     Effort: Pulmonary effort is normal. No respiratory distress.     Breath sounds: Normal breath sounds. No wheezing or rales.  Chest:     Chest wall: No tenderness.  Abdominal:     General: Bowel sounds are normal. There is no distension or abdominal bruit.     Palpations: Abdomen is soft. There is no hepatomegaly, splenomegaly, mass or pulsatile mass.     Tenderness: There is no abdominal tenderness.  Musculoskeletal:        General: Normal range of motion.     Cervical back: Normal range of motion and neck supple.     Right lower leg: Edema (2+) present.     Left lower leg: Edema (2+) present.  Lymphadenopathy:     Cervical: No cervical adenopathy.  Skin:    General: Skin is warm and dry.  Neurological:     Mental Status: Maria [name] is alert  and oriented to person, place, and time.     Deep Tendon Reflexes: Reflexes are normal and symmetric.  Psychiatric:        Behavior: Behavior normal.        Thought Content: Thought content normal.        Judgment: Judgment normal.     BP (!) 142/88   Pulse 63   Temp 97.6 F (36.4 C) (Temporal)   Ht 5' 10 (1.778 m)   Wt 198 lb (89.8 kg)   SpO2 98%   BMI 28.41 kg/m       Assessment & Plan:  Maria [name] comes in today with chief complaint of medical management of chronic issues    Diagnosis and orders addressed:  1. Essential hypertension Low sodium diet Keep diary of blood pressure at home - amLODipine-benazepril (LOTREL) 10-20 MG capsule; TAKE (1) CAPSULE BY MOUTH EVERY DAY.  Dispense: 90 capsule; Refill: 1 - CBC with Differential/Platelet - CMP14+EGFR  2. Mixed hyperlipidemia Low fat diet - Lipid panel  3. Acquired hypothyroidism Labs pending - Thyroid Panel With TSH  4. Thyroid nodule, left Will watch   5. Recurrent major depressive disorder, remission status unspecified (HCC) Stress management - escitalopram (LEXAPRO) 20 MG tablet; Take 1 tablet (20 mg total) by mouth daily.  Dispense: 30 tablet; Refill: 5  6. Primary insomnia Bedtime routine  7. Restless leg Keep legs warm at night - pramipexole (MIRAPEX) 0.5 MG tablet; TAKE (1) TABLET BY MOUTH (3) TIMES DAILY.  Dispense: 90 tablet; Refill: 1  8. Chronic midline low back pain without sciatica Moist heat - gabapentin (NEURONTIN) 400 MG capsule; TAKE (3) CAPSULES BY MOUTH AT BEDTIME.  Dispense: 90 capsule; Refill: 5  9. BMI 28.0-28.9,adult Discussed diet and exercise for person with BMI >25 Will recheck weight in 3-6 months   10. Peripheral  edema Elevate legs when sitting Compression socks during the day Back on lasix daily - furosemide (LASIX)20MG  tablet; Take 1 tablet (20 mg total) by mouth daily.  Dispense: 90 tablet; Refill: 1  11. Menopausal Continue prempro as prescribed  Labs  pending Health Maintenance reviewed Diet and exercise encouraged  Follow up plan: 6 months   Mary-Margaret Gladis, FNP

## 2023-11-15 LAB — CMP14+EGFR
ALT: 32 IU/L (ref 0–32)
AST: 25 IU/L (ref 0–40)
Albumin: 3.9 g/dL (ref 3.8–4.9)
Alkaline Phosphatase: 119 IU/L (ref 44–121)
BUN/Creatinine Ratio: 10 (ref 9–23)
BUN: 7 mg/dL (ref 6–24)
Bilirubin Total: <0.2 mg/dL (ref 0.0–1.2)
CO2: 26 mmol/L (ref 20–29)
Calcium: 8.9 mg/dL (ref 8.7–10.2)
Chloride: 101 mmol/L (ref 96–106)
Creatinine, Ser: 0.73 mg/dL (ref 0.57–1.00)
Globulin, Total: 2.2 g/dL (ref 1.5–4.5)
Glucose: 98 mg/dL (ref 70–99)
Potassium: 3.6 mmol/L (ref 3.5–5.2)
Sodium: 140 mmol/L (ref 134–144)
Total Protein: 6.1 g/dL (ref 6.0–8.5)
eGFR: 98 mL/min/1.73 (ref >59)

## 2023-11-15 LAB — CBC WITH DIFFERENTIAL/PLATELET
Basophils Absolute: 0.1 x10E3/uL (ref 0.0–0.2)
Basos: 1 %
EOS (ABSOLUTE): 0.3 x10E3/uL (ref 0.0–0.4)
Eos: 5 %
Hematocrit: 35.4 % (ref 34.0–46.6)
Hemoglobin: 11.5 g/dL (ref 11.1–15.9)
Immature Grans (Abs): 0 x10E3/uL (ref 0.0–0.1)
Immature Granulocytes: 0 %
Lymphocytes Absolute: 1.6 x10E3/uL (ref 0.7–3.1)
Lymphs: 27 %
MCH: 30.8 pg (ref 26.6–33.0)
MCHC: 32.5 g/dL (ref 31.5–35.7)
MCV: 95 fL (ref 79–97)
Monocytes Absolute: 0.4 x10E3/uL (ref 0.1–0.9)
Monocytes: 6 %
Neutrophils Absolute: 3.7 x10E3/uL (ref 1.4–7.0)
Neutrophils: 61 %
Platelets: 313 x10E3/uL (ref 150–450)
RBC: 3.73 x10E6/uL — ABNORMAL LOW (ref 3.77–5.28)
RDW: 13.2 % (ref 11.7–15.4)
WBC: 6.1 x10E3/uL (ref 3.4–10.8)

## 2023-11-15 LAB — LIPID PANEL
Chol/HDL Ratio: 3.7 ratio (ref 0.0–4.4)
Cholesterol, Total: 227 mg/dL — ABNORMAL HIGH (ref 100–199)
HDL: 61 mg/dL (ref >39)
LDL Chol Calc (NIH): 132 mg/dL — ABNORMAL HIGH (ref 0–99)
Triglycerides: 195 mg/dL — ABNORMAL HIGH (ref 0–149)
VLDL Cholesterol Cal: 34 mg/dL (ref 5–40)

## 2023-11-17 ENCOUNTER — Ambulatory Visit: Payer: Self-pay | Admitting: Nurse Practitioner

## 2023-11-17 ENCOUNTER — Other Ambulatory Visit: Payer: Self-pay | Admitting: Nurse Practitioner

## 2023-11-17 MED ORDER — ESTRADIOL-NORETHINDRONE ACET 1-0.5 MG PO TABS: 1.0000 | ORAL_TABLET | Freq: Every day | ORAL | 1 refills | Status: AC

## 2023-12-22 ENCOUNTER — Other Ambulatory Visit: Payer: Self-pay | Admitting: Nurse Practitioner

## 2023-12-30 ENCOUNTER — Ambulatory Visit

## 2024-01-21 ENCOUNTER — Other Ambulatory Visit (HOSPITAL_COMMUNITY): Payer: Self-pay

## 2024-01-22 ENCOUNTER — Other Ambulatory Visit: Payer: Self-pay | Admitting: *Deleted

## 2024-01-22 ENCOUNTER — Ambulatory Visit: Admitting: Nurse Practitioner

## 2024-01-22 ENCOUNTER — Encounter: Payer: Self-pay | Admitting: Nurse Practitioner

## 2024-01-22 VITALS — BP 154/96 | HR 80 | Temp 98.0°F | Ht 70.0 in | Wt 194.0 lb

## 2024-01-22 DIAGNOSIS — G2581 Restless legs syndrome: Secondary | ICD-10-CM

## 2024-01-22 DIAGNOSIS — J0101 Acute recurrent maxillary sinusitis: Secondary | ICD-10-CM | POA: Diagnosis not present

## 2024-01-22 DIAGNOSIS — F339 Major depressive disorder, recurrent, unspecified: Secondary | ICD-10-CM

## 2024-01-22 MED ORDER — AZITHROMYCIN 250 MG PO TABS: ORAL_TABLET | ORAL | 0 refills | Status: DC

## 2024-01-22 NOTE — Patient Instructions (Signed)

## 2024-01-22 NOTE — Progress Notes (Signed)
 Subjective:    Patient ID: Maria [name], female    DOB: Aug 24, 1970, 53 y.o.   MRN: [id number]   Chief Complaint: Sinus Problem   Sinus Problem This is a new problem. The current episode started in the past 7 days. The problem has been waxing and waning since onset. There has been no fever. Her pain is at a severity of 5/10. The pain is moderate. Associated symptoms include coughing, ear pain, headaches and sinus pressure. Pertinent negatives include no chills or shortness of breath. Past treatments include acetaminophen and oral decongestants. The treatment provided mild relief.    Patient Active Problem List   Diagnosis Date Noted   BMI 28.0-28.9,adult 07/23/2021   Restless leg 07/09/2019   Essential hypertension 06/02/2014   Thyroid  nodule, left 08/31/2013   Hypothyroidism 08/10/2012   Hyperlipidemia 08/10/2012   Depression 08/10/2012   Chronic back pain 08/10/2012   Insomnia 08/10/2012       Review of Systems  Constitutional:  Negative for chills.  HENT:  Positive for ear pain and sinus pressure.   Respiratory:  Positive for cough. Negative for shortness of breath.   Neurological:  Positive for headaches.       Objective:   Physical Exam Constitutional:      Appearance: Normal appearance.  HENT:     Right Ear: Tympanic membrane normal.     Left Ear: Tympanic membrane normal.     Nose: Congestion and rhinorrhea present.     Right Sinus: Maxillary sinus tenderness present.     Left Sinus: Maxillary sinus tenderness present.  Cardiovascular:     Rate and Rhythm: Normal rate and regular rhythm.     Pulses: Normal pulses.     Heart sounds: Normal heart sounds.  Pulmonary:     Effort: Pulmonary effort is normal.     Breath sounds: Normal breath sounds.  Neurological:     General: No focal deficit present.     Mental Status: She is alert and oriented to person, place, and time.    BP (!) 154/96   Pulse 80   Temp 98 F (36.7 C) (Temporal)   Ht 5' 10  (1.778 m)   Wt 194 lb (88 kg)   SpO2 94%   BMI 27.84 kg/m         Assessment & Plan:   Maria [name] in today with chief complaint of Sinus Problem   1. Acute recurrent maxillary sinusitis (Primary) 1. Take meds as prescribed 2. Use a cool mist humidifier especially during the winter months and when heat has been humid. 3. Use saline nose sprays frequently 4. Saline irrigations of the nose can be very helpful if done frequently.  * 4X daily for 1 week*  * Use of a nettie pot can be helpful with this. Follow directions with this* 5. Drink plenty of fluids 6. Keep thermostat turn down low 7.For any cough or congestion- mucinex 8. For fever or aces or pains- take tylenol or ibuprofen  appropriate for age and weight.  * for fevers greater than 101 orally you may alternate ibuprofen  and tylenol every  3 hours.    - azithromycin (ZITHROMAX Z-PAK) 250 MG tablet; As directed  Dispense: 6 tablet; Refill: 0    The above assessment and management plan was discussed with the patient. The patient verbalized understanding of and has agreed to the management plan. Patient is aware to call the clinic if symptoms persist or worsen. Patient is aware when to return to  the clinic for a follow-up visit. Patient educated on when it is appropriate to go to the emergency department.   Mary-Margaret Gladis, FNP

## 2024-02-11 ENCOUNTER — Other Ambulatory Visit: Payer: Self-pay | Admitting: *Deleted

## 2024-02-11 DIAGNOSIS — R600 Localized edema: Secondary | ICD-10-CM

## 2024-02-11 MED ORDER — FUROSEMIDE 20 MG PO TABS: 40.0000 mg | ORAL_TABLET | Freq: Every day | ORAL | 0 refills | Status: AC

## 2024-03-16 ENCOUNTER — Other Ambulatory Visit: Payer: Self-pay | Admitting: Nurse Practitioner

## 2024-04-01 ENCOUNTER — Other Ambulatory Visit: Payer: Self-pay | Admitting: Nurse Practitioner

## 2024-04-01 DIAGNOSIS — E876 Hypokalemia: Secondary | ICD-10-CM

## 2024-04-12 ENCOUNTER — Other Ambulatory Visit: Payer: Self-pay | Admitting: Nurse Practitioner

## 2024-04-19 ENCOUNTER — Other Ambulatory Visit: Payer: Self-pay | Admitting: Nurse Practitioner

## 2024-04-19 ENCOUNTER — Encounter: Payer: Self-pay | Admitting: Nurse Practitioner

## 2024-04-19 DIAGNOSIS — G2581 Restless legs syndrome: Secondary | ICD-10-CM

## 2024-04-19 NOTE — Telephone Encounter (Signed)
 MMM pt NTBS 30-d given today 04/19/24

## 2024-04-19 NOTE — Telephone Encounter (Signed)
 I called pt & LMTCB to make an appt w/MMM for med refill. Also, I sent pt a letter about this!

## 2024-04-23 ENCOUNTER — Ambulatory Visit: Admitting: Nurse Practitioner

## 2024-04-23 ENCOUNTER — Encounter: Payer: Self-pay | Admitting: Nurse Practitioner

## 2024-04-23 VITALS — BP 125/78 | HR 80 | Temp 98.1°F | Ht 70.0 in | Wt 209.0 lb

## 2024-04-23 DIAGNOSIS — F5101 Primary insomnia: Secondary | ICD-10-CM | POA: Diagnosis not present

## 2024-04-23 DIAGNOSIS — G2581 Restless legs syndrome: Secondary | ICD-10-CM

## 2024-04-23 DIAGNOSIS — E782 Mixed hyperlipidemia: Secondary | ICD-10-CM | POA: Diagnosis not present

## 2024-04-23 DIAGNOSIS — Z6828 Body mass index (BMI) 28.0-28.9, adult: Secondary | ICD-10-CM

## 2024-04-23 DIAGNOSIS — M545 Low back pain, unspecified: Secondary | ICD-10-CM

## 2024-04-23 DIAGNOSIS — E039 Hypothyroidism, unspecified: Secondary | ICD-10-CM

## 2024-04-23 DIAGNOSIS — F339 Major depressive disorder, recurrent, unspecified: Secondary | ICD-10-CM | POA: Diagnosis not present

## 2024-04-23 DIAGNOSIS — G8929 Other chronic pain: Secondary | ICD-10-CM | POA: Diagnosis not present

## 2024-04-23 DIAGNOSIS — I10 Essential (primary) hypertension: Secondary | ICD-10-CM | POA: Diagnosis not present

## 2024-04-23 MED ORDER — BENAZEPRIL HCL 40 MG PO TABS: 40.0000 mg | ORAL_TABLET | Freq: Every day | ORAL | 3 refills | Status: AC

## 2024-04-23 NOTE — Progress Notes (Signed)
 "  Subjective:    Patient ID: Maria [name], female    DOB: [birth year], 54 y.o.   MRN: [id number]   Chief Complaint: medical management of chronic issues     HPI:  Maria [name] is a 54 y.o. who identifies as a female who was assigned female at birth.   Social history: Lives with: husband and kids Work history: currently not working   Comes in today for follow up of the following chronic medical issues:  1. Essential hypertension No c/o chest pain, sob or headache. Does not check blood pressure at home. BP Readings from Last 3 Encounters:  01/22/24 (!) 154/96  11/14/23 (!) 142/88  01/02/23 134/88     2. Mixed hyperlipidemia Does watch diet and does occasional exercise. Lab Results  Component Value Date   CHOL 227 (H) 11/14/2023   HDL 61 11/14/2023   LDLCALC 132 (H) 11/14/2023   TRIG 195 (H) 11/14/2023   CHOLHDL 3.7 11/14/2023     3. Acquired hypothyroidism No issues that aware of Lab Results  Component Value Date   TSH 0.264 (L) 01/02/2023   T4TOTAL 8.5 01/02/2023     4. Thyroid  nodule, left Was tested and there was no concerns. No change in size  5. Recurrent major depressive disorder, remission status unspecified (HCC) Is on lexapro  and is doing well. Husband recently diagnosed with prostate cancer and is getting ready to have prostatectomy.    04/23/2024    3:00 PM 01/22/2024    3:50 PM 11/14/2023   11:23 AM  Depression screen PHQ 2/9  Decreased Interest 1 0 0  Down, Depressed, Hopeless 1 0 0  PHQ - 2 Score 2 0 0  Altered sleeping 3  1  Tired, decreased energy 3  1  Change in appetite 1  1  Feeling bad or failure about yourself  1  0  Trouble concentrating 0  0  Moving slowly or fidgety/restless 0  0  Suicidal thoughts 0  0  PHQ-9 Score 10  3   Difficult doing work/chores Somewhat difficult  Not difficult at all     Data saved with a previous flowsheet row definition   6. Primary insomnia No issues sleeping and is doing weel  7.  Restless leg Is on neurontin  and is doing well  8. Chronic midline low back pain without sciatica Has chronic back pain had emergency surgery in [place]  several months ago. Is doing better.   9. BMI 28.0-28.9,adult Weight is up 30lbs- patient quit smoking Weight is up 15 lbs Wt Readings from Last 3 Encounters:  04/23/24 209 lb (94.8 kg)  01/22/24 194 lb (88 kg)  11/14/23 198 lb (89.8 kg)   BMI Readings from Last 3 Encounters:  04/23/24 29.99 kg/m  01/22/24 27.84 kg/m  11/14/23 28.41 kg/m       New complaints: None today  Allergies  Allergen Reactions   Compazine [Prochlorperazine Edisylate]    Sulfa Antibiotics Rash   Outpatient Encounter Medications as of 04/23/2024  Medication Sig   acetaminophen (TYLENOL) 500 MG tablet Take 500 mg by mouth every 6 (six) hours.   amLODipine -benazepril  (LOTREL) 10-20 MG capsule TAKE (1) CAPSULE BY MOUTH EVERY DAY.**NEEDS TO BE SEEN BEFORE NEXT REFILL**   azithromycin  (ZITHROMAX  Z-PAK) 250 MG tablet As directed   calcium  carbonate (TUMS - DOSED IN MG ELEMENTAL CALCIUM ) 500 MG chewable tablet Chew 2 tablets by mouth 4 (four) times daily as needed for indigestion or heartburn.   escitalopram  (LEXAPRO ) 20 MG  tablet Take 1 tablet (20 mg total) by mouth daily.   estradiol -norethindrone  (ACTIVELLA) 1-0.5 MG tablet Take 1 tablet by mouth daily. To replace all previous orders of menopausal meds   furosemide  (LASIX ) 20 MG tablet Take 2 tablets (40 mg total) by mouth daily.   gabapentin  (NEURONTIN ) 400 MG capsule TAKE (3) CAPSULES BY MOUTH AT BEDTIME.   HYDROcodone-acetaminophen (NORCO) 7.5-325 MG tablet Take 1 tablet by mouth 4 (four) times daily as needed.   ibuprofen  (ADVIL ) 800 MG tablet TAKE (1) TABLET BY MOUTH EVERY EIGHT HOURS AS NEEDED.   Multiple Vitamin (MULTIVITAMIN) tablet Take 1 tablet by mouth daily.   oxyCODONE (ROXICODONE) 15 MG immediate release tablet Take 15 mg by mouth every 6 (six) hours.   polyethylene glycol (MIRALAX /  GLYCOLAX) 17 g packet Take 17 g by mouth daily. Dissolve in 8 ounces of water and drink once a day for 10 days.   potassium chloride  SA (KLOR-CON  M) 20 MEQ tablet Take 1 tablet (20 mEq total) by mouth 2 (two) times daily.   pramipexole  (MIRAPEX ) 0.5 MG tablet Take 1 tablet (0.5 mg total) by mouth 3 (three) times daily. **NEEDS TO BE SEEN BEFORE NEXT REFILL**   PREMPRO  0.3-1.5 MG tablet TAKE (1) TABLET BY MOUTH ONCE DAILY.   QUEtiapine  (SEROQUEL ) 25 MG tablet Take 1 tablet (25 mg total) by mouth at bedtime.   sennosides-docusate sodium (SENOKOT-S) 8.6-50 MG tablet Take 1 tablet by mouth daily. At bedtime per discharge orders 09/15/2023   No facility-administered encounter medications on file as of 04/23/2024.    Past Surgical History:  Procedure Laterality Date   BACK SURGERY  2018   BREAST EXCISIONAL BIOPSY Right 1993   BREAST SURGERY     lumpectomy   CESAREAN SECTION     CHOLECYSTECTOMY     ENDOMETRIAL ABLATION     l5-s1 fusion  2008, 2009, 2011   THYROIDECTOMY Right 2001   TOTAL HIP ARTHROPLASTY Left     Family History  Problem Relation Age of Onset   Breast cancer Mother    Hypertension Mother    Cancer Mother        breast   Hypothyroidism Mother    Hypertension Father    Hypothyroidism Father       Controlled substance contract: n/a     Review of Systems  Constitutional:  Negative for diaphoresis.  Eyes:  Negative for pain.  Respiratory:  Negative for shortness of breath.   Cardiovascular:  Negative for chest pain, palpitations and leg swelling.  Gastrointestinal:  Negative for abdominal pain.  Endocrine: Negative for polydipsia.  Skin:  Negative for rash.  Neurological:  Negative for dizziness, weakness and headaches.  Hematological:  Does not bruise/bleed easily.  All other systems reviewed and are negative.      Objective:   Physical Exam Vitals and nursing note reviewed.  Constitutional:      General: She is not in acute distress.    Appearance:  Normal appearance. She is well-developed.  HENT:     Head: Normocephalic.     Right Ear: Tympanic membrane normal.     Left Ear: Tympanic membrane normal.     Nose: Nose normal.     Mouth/Throat:     Mouth: Mucous membranes are moist.  Eyes:     Pupils: Pupils are equal, round, and reactive to light.  Neck:     Vascular: No carotid bruit or JVD.  Cardiovascular:     Rate and Rhythm: Normal rate and regular  rhythm.     Heart sounds: Normal heart sounds.  Pulmonary:     Effort: Pulmonary effort is normal. No respiratory distress.     Breath sounds: Normal breath sounds. No wheezing or rales.  Chest:     Chest wall: No tenderness.  Abdominal:     General: Bowel sounds are normal. There is no distension or abdominal bruit.     Palpations: Abdomen is soft. There is no hepatomegaly, splenomegaly, mass or pulsatile mass.     Tenderness: There is no abdominal tenderness.  Musculoskeletal:        General: Normal range of motion.     Cervical back: Normal range of motion and neck supple.     Right lower leg: Edema (2+) present.     Left lower leg: Edema (2+) present.  Lymphadenopathy:     Cervical: No cervical adenopathy.  Skin:    General: Skin is warm and dry.  Neurological:     Mental Status: She is alert and oriented to person, place, and time.     Deep Tendon Reflexes: Reflexes are normal and symmetric.  Psychiatric:        Behavior: Behavior normal.        Thought Content: Thought content normal.        Judgment: Judgment normal.     BP 125/78   Pulse 80   Temp 98.1 F (36.7 C) (Temporal)   Ht 5' 10 (1.778 m)   Wt 209 lb (94.8 kg)   SpO2 96%   BMI 29.99 kg/m        Assessment & Plan:  DEBE ANFINSON comes in today with chief complaint of medical management of chronic issues    Diagnosis and orders addressed:  1. Essential hypertension Low sodium diet Keep diary of blood pressure at home Changed blood pressure meds from amlodipine - benazepril  to  benazepril  40mg  daioly - -benazepril  (LOTREL) 40MG  capsule; TAKE (1) CAPSULE BY MOUTH EVERY DAY.  Dispense: 90 capsule; Refill: 1 - CBC with Differential/Platelet - CMP14+EGFR  2. Mixed hyperlipidemia Low fat diet - Lipid panel  3. Acquired hypothyroidism Labs pending - Thyroid  Panel With TSH  4. Thyroid  nodule, left Will watch   5. Recurrent major depressive disorder, remission status unspecified (HCC) Stress management - escitalopram  (LEXAPRO ) 20 MG tablet; Take 1 tablet (20 mg total) by mouth daily.  Dispense: 30 tablet; Refill: 5  6. Primary insomnia Bedtime routine  7. Restless leg Keep legs warm at night - pramipexole  (MIRAPEX ) 0.5 MG tablet; TAKE (1) TABLET BY MOUTH (3) TIMES DAILY.  Dispense: 90 tablet; Refill: 1  8. Chronic midline low back pain without sciatica Moist heat - gabapentin  (NEURONTIN ) 400 MG capsule; TAKE (3) CAPSULES BY MOUTH AT BEDTIME.  Dispense: 90 capsule; Refill: 5  9. BMI 28.0-28.9,adult Discussed diet and exercise for person with BMI >25 Will recheck weight in 3-6 months   10. Peripheral edema Elevate legs when sitting Compression socks during the day Back on lasix  daily - furosemide  (LASIX )20MG  tablet; Take 1 tablet (20 mg total) by mouth daily.  Dispense: 90 tablet; Refill: 1  11. Menopausal Continue prempro  as prescribed  Labs pending Health Maintenance reviewed Diet and exercise encouraged  Follow up plan: 6 months   Mary-Margaret Gladis, FNP   "

## 2024-04-23 NOTE — Patient Instructions (Signed)
 Peripheral Edema  Peripheral edema is swelling that is caused by a buildup of fluid. Peripheral edema most often affects the lower legs, ankles, and feet. It can also develop in the arms, hands, and face. The area of the body that has peripheral edema will look swollen. It may also feel heavy or warm. Your clothes may start to feel tight. Pressing on the area may make a temporary dent in your skin (pitting edema). You may not be able to move your swollen arm or leg as much as usual. There are many causes of peripheral edema. It can happen because of a complication of other conditions such as heart failure, kidney disease, or a problem with your circulation. It also can be a side effect of certain medicines or happen because of an infection. It often happens to women during pregnancy. Sometimes, the cause is not known. Follow these instructions at home: Managing pain, stiffness, and swelling  Raise (elevate) your legs while you are sitting or lying down. Move around often to prevent stiffness and to reduce swelling. Do not sit or stand for long periods of time. Do not wear tight clothing. Do not wear garters on your upper legs. Exercise your legs to get your circulation going. This helps to move the fluid back into your blood vessels, and it may help the swelling go down. Wear compression stockings as told by your health care provider. These stockings help to prevent blood clots and reduce swelling in your legs. It is important that these are the correct size. These stockings should be prescribed by your doctor to prevent possible injuries. If elastic bandages or wraps are recommended, use them as told by your health care provider. Medicines Take over-the-counter and prescription medicines only as told by your health care provider. Your health care provider may prescribe medicine to help your body get rid of excess water (diuretic). Take this medicine if you are told to take it. General  instructions Eat a low-salt (low-sodium) diet as told by your health care provider. Sometimes, eating less salt may reduce swelling. Pay attention to any changes in your symptoms. Moisturize your skin daily to help prevent skin from cracking and draining. Keep all follow-up visits. This is important. Contact a health care provider if: You have a fever. You have swelling in only one leg. You have increased swelling, redness, or pain in one or both of your legs. You have drainage or sores at the area where you have edema. Get help right away if: You have edema that starts suddenly or is getting worse, especially if you are pregnant or have a medical condition. You develop shortness of breath, especially when you are lying down. You have pain in your chest or abdomen. You feel weak. You feel like you will faint. These symptoms may be an emergency. Get help right away. Call 911. Do not wait to see if the symptoms will go away. Do not drive yourself to the hospital. Summary Peripheral edema is swelling that is caused by a buildup of fluid. Peripheral edema most often affects the lower legs, ankles, and feet. Move around often to prevent stiffness and to reduce swelling. Do not sit or stand for long periods of time. Pay attention to any changes in your symptoms. Contact a health care provider if you have edema that starts suddenly or is getting worse, especially if you are pregnant or have a medical condition. Get help right away if you develop shortness of breath, especially when lying down.  This information is not intended to replace advice given to you by your health care provider. Make sure you discuss any questions you have with your health care provider. Document Revised: 11/20/2020 Document Reviewed: 11/20/2020 Elsevier Patient Education  2024 [employer].

## 2024-04-24 LAB — LIPID PANEL
Chol/HDL Ratio: 3.3 ratio (ref 0.0–4.4)
Cholesterol, Total: 221 mg/dL — ABNORMAL HIGH (ref 100–199)
HDL: 66 mg/dL
LDL Chol Calc (NIH): 129 mg/dL — ABNORMAL HIGH (ref 0–99)
Triglycerides: 149 mg/dL (ref 0–149)
VLDL Cholesterol Cal: 26 mg/dL (ref 5–40)

## 2024-04-24 LAB — CMP14+EGFR
ALT: 13 [IU]/L (ref 0–32)
AST: 16 [IU]/L (ref 0–40)
Albumin: 4.3 g/dL (ref 3.8–4.9)
Alkaline Phosphatase: 106 [IU]/L (ref 49–135)
BUN/Creatinine Ratio: 16 (ref 9–23)
BUN: 14 mg/dL (ref 6–24)
Bilirubin Total: 0.2 mg/dL (ref 0.0–1.2)
CO2: 21 mmol/L (ref 20–29)
Calcium: 9.6 mg/dL (ref 8.7–10.2)
Chloride: 104 mmol/L (ref 96–106)
Creatinine, Ser: 0.86 mg/dL (ref 0.57–1.00)
Globulin, Total: 2.5 g/dL (ref 1.5–4.5)
Glucose: 97 mg/dL (ref 70–99)
Potassium: 4.4 mmol/L (ref 3.5–5.2)
Sodium: 139 mmol/L (ref 134–144)
Total Protein: 6.8 g/dL (ref 6.0–8.5)
eGFR: 80 mL/min/{1.73_m2}

## 2024-04-24 LAB — CBC WITH DIFFERENTIAL/PLATELET
Basophils Absolute: 0.1 10*3/uL (ref 0.0–0.2)
Basos: 1 %
EOS (ABSOLUTE): 0.1 10*3/uL (ref 0.0–0.4)
Eos: 2 %
Hematocrit: 37.7 % (ref 34.0–46.6)
Hemoglobin: 12.4 g/dL (ref 11.1–15.9)
Immature Grans (Abs): 0 10*3/uL (ref 0.0–0.1)
Immature Granulocytes: 0 %
Lymphocytes Absolute: 1.8 10*3/uL (ref 0.7–3.1)
Lymphs: 25 %
MCH: 30.1 pg (ref 26.6–33.0)
MCHC: 32.9 g/dL (ref 31.5–35.7)
MCV: 92 fL (ref 79–97)
Monocytes Absolute: 0.5 10*3/uL (ref 0.1–0.9)
Monocytes: 6 %
Neutrophils Absolute: 4.8 10*3/uL (ref 1.4–7.0)
Neutrophils: 66 %
Platelets: 431 10*3/uL (ref 150–450)
RBC: 4.12 x10E6/uL (ref 3.77–5.28)
RDW: 13 % (ref 11.7–15.4)
WBC: 7.3 10*3/uL (ref 3.4–10.8)

## 2024-04-26 ENCOUNTER — Ambulatory Visit: Admitting: Nurse Practitioner

## 2024-04-27 ENCOUNTER — Ambulatory Visit: Payer: Self-pay | Admitting: Nurse Practitioner

## 2024-10-26 ENCOUNTER — Ambulatory Visit: Admitting: Nurse Practitioner
